# Patient Record
Sex: Male | Born: 1940 | Race: White | Hispanic: No | Marital: Married | Smoking: Never smoker
Health system: Southern US, Community
[De-identification: ages and names within clinical notes are randomized; demographics above are authoritative.]

## PROBLEM LIST (undated history)

## (undated) DIAGNOSIS — F419 Anxiety disorder, unspecified: Secondary | ICD-10-CM

## (undated) DIAGNOSIS — Z85828 Personal history of other malignant neoplasm of skin: Secondary | ICD-10-CM

## (undated) DIAGNOSIS — T4145XA Adverse effect of unspecified anesthetic, initial encounter: Secondary | ICD-10-CM

## (undated) DIAGNOSIS — G4700 Insomnia, unspecified: Secondary | ICD-10-CM

## (undated) DIAGNOSIS — T8859XA Other complications of anesthesia, initial encounter: Secondary | ICD-10-CM

## (undated) DIAGNOSIS — C61 Malignant neoplasm of prostate: Secondary | ICD-10-CM

## (undated) DIAGNOSIS — E785 Hyperlipidemia, unspecified: Secondary | ICD-10-CM

## (undated) DIAGNOSIS — H409 Unspecified glaucoma: Secondary | ICD-10-CM

## (undated) DIAGNOSIS — N400 Enlarged prostate without lower urinary tract symptoms: Secondary | ICD-10-CM

## (undated) DIAGNOSIS — R972 Elevated prostate specific antigen [PSA]: Secondary | ICD-10-CM

## (undated) DIAGNOSIS — Z8489 Family history of other specified conditions: Secondary | ICD-10-CM

## (undated) DIAGNOSIS — I10 Essential (primary) hypertension: Secondary | ICD-10-CM

## (undated) HISTORY — PX: TONSILLECTOMY: SUR1361

## (undated) HISTORY — DX: Personal history of other malignant neoplasm of skin: Z85.828

## (undated) HISTORY — PX: ANAL FISSURE REPAIR: SHX2312

## (undated) HISTORY — PX: COLONOSCOPY: SHX174

## (undated) HISTORY — PX: HERNIA REPAIR: SHX51

---
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
## 2005-04-09 DIAGNOSIS — Z85828 Personal history of other malignant neoplasm of skin: Secondary | ICD-10-CM

## 2005-04-09 HISTORY — DX: Personal history of other malignant neoplasm of skin: Z85.828

## 2013-06-23 ENCOUNTER — Observation Stay: Payer: Self-pay | Admitting: Family Medicine

## 2013-06-23 LAB — COMPREHENSIVE METABOLIC PANEL
ANION GAP: 5 — AB (ref 7–16)
Albumin: 3.4 g/dL (ref 3.4–5.0)
Alkaline Phosphatase: 51 U/L
BUN: 12 mg/dL (ref 7–18)
Bilirubin,Total: 0.5 mg/dL (ref 0.2–1.0)
CALCIUM: 8.8 mg/dL (ref 8.5–10.1)
Chloride: 95 mmol/L — ABNORMAL LOW (ref 98–107)
Co2: 29 mmol/L (ref 21–32)
Creatinine: 0.87 mg/dL (ref 0.60–1.30)
EGFR (African American): >60
EGFR (Non-African Amer.): >60
GLUCOSE: 96 mg/dL (ref 65–99)
Osmolality: 259 (ref 275–301)
Potassium: 4.2 mmol/L (ref 3.5–5.1)
SGOT(AST): 25 U/L (ref 15–37)
SGPT (ALT): 18 U/L (ref 12–78)
SODIUM: 129 mmol/L — AB (ref 136–145)
TOTAL PROTEIN: 6.6 g/dL (ref 6.4–8.2)

## 2013-06-23 LAB — URINALYSIS, COMPLETE
BILIRUBIN, UR: NEGATIVE
BLOOD: NEGATIVE
Bacteria: NONE SEEN
Glucose,UR: NEGATIVE mg/dL (ref 0–75)
Hyaline Cast: 2
Ketone: NEGATIVE
LEUKOCYTE ESTERASE: NEGATIVE
NITRITE: NEGATIVE
PH: 7 (ref 4.5–8.0)
Protein: NEGATIVE
RBC,UR: <1 /HPF (ref 0–5)
SQUAMOUS EPITHELIAL: NONE SEEN
Specific Gravity: 1.012 (ref 1.003–1.030)
WBC UR: 1 /HPF (ref 0–5)

## 2013-06-23 LAB — CBC
HCT: 40 % (ref 40.0–52.0)
HGB: 13.8 g/dL (ref 13.0–18.0)
MCH: 32.3 pg (ref 26.0–34.0)
MCHC: 34.5 g/dL (ref 32.0–36.0)
MCV: 93 fL (ref 80–100)
Platelet: 164 10*3/uL (ref 150–440)
RBC: 4.29 10*6/uL — AB (ref 4.40–5.90)
RDW: 12.6 % (ref 11.5–14.5)
WBC: 5.9 10*3/uL (ref 3.8–10.6)

## 2013-06-23 LAB — TROPONIN I
Troponin-I: <0.02 ng/mL
Troponin-I: <0.02 ng/mL
Troponin-I: <0.02 ng/mL

## 2013-06-23 LAB — CK-MB
CK-MB: 2.9 ng/mL (ref 0.5–3.6)
CK-MB: 2.9 ng/mL (ref 0.5–3.6)

## 2013-06-23 LAB — CK TOTAL AND CKMB (NOT AT ARMC)
CK, TOTAL: 186 U/L
CK-MB: 2.8 ng/mL (ref 0.5–3.6)

## 2013-06-24 LAB — CBC WITH DIFFERENTIAL/PLATELET
BASOS ABS: 0 10*3/uL (ref 0.0–0.1)
Basophil %: 0.4 %
EOS PCT: 1.1 %
Eosinophil #: 0.1 10*3/uL (ref 0.0–0.7)
HCT: 41.3 % (ref 40.0–52.0)
HGB: 14 g/dL (ref 13.0–18.0)
Lymphocyte #: 2.1 10*3/uL (ref 1.0–3.6)
Lymphocyte %: 29.4 %
MCH: 31.6 pg (ref 26.0–34.0)
MCHC: 33.9 g/dL (ref 32.0–36.0)
MCV: 93 fL (ref 80–100)
MONO ABS: 0.5 x10 3/mm (ref 0.2–1.0)
Monocyte %: 7.7 %
NEUTROS ABS: 4.3 10*3/uL (ref 1.4–6.5)
Neutrophil %: 61.4 %
Platelet: 176 10*3/uL (ref 150–440)
RBC: 4.42 10*6/uL (ref 4.40–5.90)
RDW: 12.7 % (ref 11.5–14.5)
WBC: 7 10*3/uL (ref 3.8–10.6)

## 2013-06-24 LAB — LIPID PANEL
Cholesterol: 123 mg/dL (ref 0–200)
HDL Cholesterol: 66 mg/dL — ABNORMAL HIGH (ref 40–60)
Ldl Cholesterol, Calc: 42 mg/dL (ref 0–100)
Triglycerides: 73 mg/dL (ref 0–200)
VLDL CHOLESTEROL, CALC: 15 mg/dL (ref 5–40)

## 2013-06-24 LAB — BASIC METABOLIC PANEL
Anion Gap: 5 — ABNORMAL LOW (ref 7–16)
BUN: 11 mg/dL (ref 7–18)
CREATININE: 0.92 mg/dL (ref 0.60–1.30)
Calcium, Total: 8.2 mg/dL — ABNORMAL LOW (ref 8.5–10.1)
Chloride: 100 mmol/L (ref 98–107)
Co2: 27 mmol/L (ref 21–32)
EGFR (African American): >60
GLUCOSE: 92 mg/dL (ref 65–99)
OSMOLALITY: 264 (ref 275–301)
Potassium: 4 mmol/L (ref 3.5–5.1)
Sodium: 132 mmol/L — ABNORMAL LOW (ref 136–145)

## 2013-06-24 LAB — TSH: THYROID STIMULATING HORM: 0.954 u[IU]/mL

## 2013-12-22 ENCOUNTER — Ambulatory Visit: Payer: Self-pay

## 2014-07-31 NOTE — Discharge Summary (Signed)
PATIENT NAME:  Vincent Schneider, Vincent Schneider MR#:  [id number] DATE OF BIRTH:  09-26-1940  DATE OF ADMISSION:  06/23/2013 DATE OF DISCHARGE:  06/24/2013  DISCHARGE DIAGNOSES: Syncope secondary to orthostatic hypotension, resolved.   DISCHARGE MEDICATIONS:  1.  Buspirone 7.5 mg p.o. b.i.d. 2.  Trazodone 150 mg p.o. at bedtime.  3.  Simvastatin 10 mg one tab p.o. at bedtime.  4.  Aspirin 81 mg p.o. daily.  5.  Melatonin 5 mg p.o. at bedtime.  6.  Tamsulosin 0.4 mg daily.  7.  Fluticasone nasal 50 mcg once per each nostril daily.  8.  Alavert 10 mg p.o. daily.  9.  Xalatan 0.005% ophthalmic solution one drop each eye at bedtime.   CONSULTS: None.   PROCEDURES: None.   PERTINENT LABORATORIES AND STUDIES: CT of the head was negative.   EKG showed normal sinus rhythm with AV block.   Echo showed EF of 60% to 65%.   Cardiac enzymes negative x3.   Carotid ultrasound less than 50% stenosis bilaterally.   TSH 0.95, sodium 132, potassium 4, creatinine 0.92, glucose 92. White blood cell count 7, hemoglobin 14, platelets 176.   BRIEF HOSPITAL COURSE: Syncope secondary to hypotension, orthostatic. The patient initially came in with acute syncopal episode. When blood pressure was checked in the ER, it did drop from 140s to 110s. Full workup was performed. CT of the head was negative. Studies as stated above. On day of discharge, the patient's blood pressure remained stable with different positions. I believe that this was secondary to low hydration, low volume status along with Flomax. Given his significant benign prostatic hypertrophy symptoms, we will continue with the Flomax since his blood pressure is now stabilized, is not have any further symptoms. Will focus more on hydration.   PLAN: To discharge to home.   FOLLOWUP: Dr. Netty Starring within the next week. We will try to get him in next Tuesday for his annual physical and also for his hospital followup appointment.   ____________________________ Dion Body, MD kl:np D: 06/24/2013 14:43:37 ET T: 06/24/2013 21:50:07 ET JOB#: [id number]  cc: Dion Body, MD, <Dictator> Dion Body MD ELECTRONICALLY SIGNED 07/07/2013 15:21

## 2014-07-31 NOTE — H&P (Signed)
PATIENT NAME:  Vincent, Schneider MR#:  [id number] DATE OF BIRTH:  [birth year]  DATE OF ADMISSION:  06/23/2013  PRIMARY CARE PROVIDER: Dion Body, MD  ED REFERRING PHYSICIAN: Dr. Owens Shark  CHIEF COMPLAINT: Syncope.   HISTORY OF PRESENT ILLNESS: The patient is a 74 year old white male with history of hyperlipidemia, history of BPH, and depression who states that he was in his usual state of health up until about 10 days ago when he started having severe sinus drainage and then about a week ago he was in his exercise class and hurt his back on the right, but now that is improved, but over the past 3 to 4 days he has been feeling very weak and lack of energy. He has been feeling dizzy, especially with standing. Earlier today woke up at 5:30 to make some coffee and felt dizzy and passed out. He found himself on the floor. The patient was brought to the ED and he was noted to have a sodium level of 129 and when his blood pressure was checked it did drop to 110s from 140s. He otherwise has not been having any chest pain or shortness of breath or palpitations. He has not had any nausea, vomiting, or diarrhea. He has been eating well, but not drinking as good. He denies any fevers, chills. No urinary frequency, urgency or hesitancy.   PAST MEDICAL HISTORY: 1.  Hyperlipidemia.  2.  BPH. 3.  Depression.   PAST SURGICAL HISTORY:  1.  Status post tonsillectomy. 2.  Hernia repair. 3.  Anal fissure repair.   ALLERGIES: None.   CURRENT HOME MEDICATIONS: Alavert 10 mg 1 tab p.o. daily, aspirin 81 one 1 tab p.o. daily, buspirone 7.5 one tab p.o. b.i.d., fluticasone 1 spray daily, melatonin 5 daily, simvastatin 10 at bedtime, Flomax 0.4 daily, trazodone 150 at bedtime, Xalatan eye drops 1 drop to both effected eyes at bedtime.   SOCIAL HISTORY: Does not smoke. Drinks 1 glass of drink daily. No drug use.   FAMILY HISTORY: Father died of a CVA. Mother lived up to 63.  REVIEW OF SYSTEMS:  CONSTITUTIONAL:  Denies any fevers, chills. No weight loss or weight gain.  HEENT: Denies any visual difficulties. No blurred vision. Has a history of glaucoma. No cataracts. EARS, NOSE, AND THROAT: Complains of postnasal drip. Denies any difficulty with swallowing. Denies any ringing in the ears. No difficulty hearing.  CARDIOVASCULAR: Denies any chest pains, palpitations. Has syncope as above. No history of coronary artery disease. No hypertension.  PULMONARY: Denies any cough, wheezing, hemoptysis.  GASTROINTESTINAL: Denies any nausea, vomiting, diarrhea.  GENITOURINARY: Denies any frequency, urgency or hesitancy.  SKIN: Denies any skin rash.  LYMPHATICS: Denies any lymph node enlargement.  VASCULAR: Denies any claudication symptoms.  PSYCHIATRIC: Does have a history of some depression.  NEUROLOGIC: No CVA, TIA or seizures.   PHYSICAL EXAMINATION: VITAL SIGNS: Temperature 97.6, pulse 75, respirations 18, blood pressure 115/76, O2 98%.  GENERAL: The patient is a well-developed, well-nourished male in no acute distress.  HEENT: Head atraumatic, normocephalic. Pupils equally round and reactive to light and accommodation. There is no conjunctival pallor. No scleral icterus. Nasal exam shows no drainage or ulceration. Oropharynx is clear without any exudate.  NECK: Supple without any JVD.  CARDIOVASCULAR: Regular rate and rhythm. No murmurs, rubs.  LUNGS: Clear to auscultation. ABDOMEN: Soft, nontender, nondistended. Positive bowel sounds x4. No hepatosplenomegaly.  EXTREMITIES: No clubbing, cyanosis, or edema.  SKIN: No rash.  LYMPHATICS: No lymph nodes throughout palpable.  VASCULAR: Lower extremity: Good DP, PT pulses.   DIAGNOSTIC DATA: CT scan of the head shows small vessel disease. No acute abnormality.   Urinalysis: Nitrites negative, leukocytes negative. WBC 5.9, hemoglobin 13.8, platelet count 164,000. Troponin less than 0.02. LFTs are normal. Glucose 96, BUN 12, creatinine 0.87, sodium 129,  potassium 4.2, chloride 95, CO2 22, calcium 8.8.   EKG: Normal sinus rhythm with left axis deviation.   ASSESSMENT AND PLAN: The patient is a 74 year old white male who presents with dizziness for the past few days and now has syncope with collapse.  1.  Syncope. Likely due to dehydration with low sodium and orthostatic changes. At this time, we will give him IV fluids, repeat orthostatic blood pressure in the morning. We will also place him on telemetry. Check echocardiogram of the heart and carotid Dopplers.  2.  Benign prostatic hypertrophy. Continue Flomax.  3.  Depression. Continue trazodone and bupropion. 4.  Hyperlipidemia. Continue simvastatin as taking at home.  5.  Miscellaneous. The patient will be on heparin for deep vein thrombosis prophylaxis.  The patient's care will be transferred to Dr. Netty Starring.  TIME SPENT: 50 minutes.   ____________________________ Vincent Mosses Posey Pronto, MD shp:sb D: 06/23/2013 09:21:28 ET T: 06/23/2013 09:33:40 ET JOB#: [id number]  cc: Peggie Hornak H. Posey Pronto, MD, <Dictator> Alric Seton MD ELECTRONICALLY SIGNED 06/24/2013 8:27

## 2014-09-26 ENCOUNTER — Emergency Department
Admission: EM | Admit: 2014-09-26 | Discharge: 2014-09-26 | Disposition: A | Discharge status: Home / Self Care | Payer: Medicare Other | Attending: Emergency Medicine | Admitting: Emergency Medicine

## 2014-09-26 ENCOUNTER — Emergency Department: Payer: Medicare Other

## 2014-09-26 ENCOUNTER — Encounter: Payer: Self-pay | Admitting: Emergency Medicine

## 2014-09-26 DIAGNOSIS — K5900 Constipation, unspecified: Secondary | ICD-10-CM | POA: Insufficient documentation

## 2014-09-26 DIAGNOSIS — R109 Unspecified abdominal pain: Secondary | ICD-10-CM | POA: Diagnosis present

## 2014-09-26 DIAGNOSIS — R1030 Lower abdominal pain, unspecified: Secondary | ICD-10-CM

## 2014-09-26 HISTORY — DX: Elevated prostate specific antigen (PSA): R97.20

## 2014-09-26 HISTORY — DX: Anxiety disorder, unspecified: F41.9

## 2014-09-26 HISTORY — DX: Insomnia, unspecified: G47.00

## 2014-09-26 HISTORY — DX: Unspecified glaucoma: H40.9

## 2014-09-26 HISTORY — DX: Benign prostatic hyperplasia without lower urinary tract symptoms: N40.0

## 2014-09-26 HISTORY — DX: Hyperlipidemia, unspecified: E78.5

## 2014-09-26 LAB — COMPREHENSIVE METABOLIC PANEL
ALK PHOS: 47 U/L (ref 38–126)
ALT: 13 U/L — ABNORMAL LOW (ref 17–63)
AST: 19 U/L (ref 15–41)
Albumin: 4 g/dL (ref 3.5–5.0)
Anion gap: 7 (ref 5–15)
BILIRUBIN TOTAL: 1 mg/dL (ref 0.3–1.2)
BUN: 12 mg/dL (ref 6–20)
CHLORIDE: 95 mmol/L — AB (ref 101–111)
CO2: 28 mmol/L (ref 22–32)
CREATININE: 0.88 mg/dL (ref 0.61–1.24)
Calcium: 8.4 mg/dL — ABNORMAL LOW (ref 8.9–10.3)
GFR calc Af Amer: >60 mL/min (ref >60)
GFR calc non Af Amer: >60 mL/min (ref >60)
Glucose, Bld: 108 mg/dL — ABNORMAL HIGH (ref 65–99)
Potassium: 4.1 mmol/L (ref 3.5–5.1)
Sodium: 130 mmol/L — ABNORMAL LOW (ref 135–145)
Total Protein: 6.6 g/dL (ref 6.5–8.1)

## 2014-09-26 LAB — CBC
HCT: 37.8 % — ABNORMAL LOW (ref 40.0–52.0)
HEMOGLOBIN: 12.7 g/dL — AB (ref 13.0–18.0)
MCH: 31.4 pg (ref 26.0–34.0)
MCHC: 33.5 g/dL (ref 32.0–36.0)
MCV: 93.8 fL (ref 80.0–100.0)
PLATELETS: 187 10*3/uL (ref 150–440)
RBC: 4.03 MIL/uL — AB (ref 4.40–5.90)
RDW: 13.7 % (ref 11.5–14.5)
WBC: 10.8 10*3/uL — AB (ref 3.8–10.6)

## 2014-09-26 LAB — LIPASE, BLOOD: Lipase: 25 U/L (ref 22–51)

## 2014-09-26 MED ORDER — HYDROMORPHONE HCL 1 MG/ML IJ SOLN
INTRAMUSCULAR | Status: AC
  Administered 2014-09-26: 0.5 mg via INTRAVENOUS
  Filled 2014-09-26: qty 1

## 2014-09-26 MED ORDER — ONDANSETRON HCL 4 MG/2ML IJ SOLN
INTRAMUSCULAR | Status: AC
  Administered 2014-09-26: 4 mg via INTRAVENOUS
  Filled 2014-09-26: qty 2

## 2014-09-26 MED ORDER — MAGNESIUM CITRATE PO SOLN
ORAL | Status: AC
  Administered 2014-09-26: 1 via ORAL
  Filled 2014-09-26: qty 296

## 2014-09-26 MED ORDER — ONDANSETRON HCL 4 MG/2ML IJ SOLN
4.0000 mg | INTRAMUSCULAR | Status: AC
  Administered 2014-09-26: 4 mg via INTRAVENOUS

## 2014-09-26 MED ORDER — MAGNESIUM CITRATE PO SOLN
1.0000 | Freq: Once | ORAL | Status: AC
  Administered 2014-09-26: 1 via ORAL

## 2014-09-26 MED ORDER — MORPHINE SULFATE 4 MG/ML IJ SOLN
INTRAMUSCULAR | Status: AC
  Administered 2014-09-26: 4 mg via INTRAVENOUS
  Filled 2014-09-26: qty 1

## 2014-09-26 MED ORDER — IOHEXOL 240 MG/ML SOLN
25.0000 mL | Freq: Once | INTRAMUSCULAR | Status: AC | PRN
  Administered 2014-09-26: 25 mL via ORAL

## 2014-09-26 MED ORDER — IOHEXOL 300 MG/ML  SOLN
100.0000 mL | Freq: Once | INTRAMUSCULAR | Status: AC | PRN
  Administered 2014-09-26: 100 mL via INTRAVENOUS

## 2014-09-26 MED ORDER — MORPHINE SULFATE 4 MG/ML IJ SOLN
4.0000 mg | Freq: Once | INTRAMUSCULAR | Status: AC
  Administered 2014-09-26: 4 mg via INTRAVENOUS

## 2014-09-26 MED ORDER — SODIUM CHLORIDE 0.9 % IV BOLUS (SEPSIS)
1000.0000 mL | Freq: Once | INTRAVENOUS | Status: AC
  Administered 2014-09-26: 1000 mL via INTRAVENOUS

## 2014-09-26 MED ORDER — HYDROMORPHONE HCL 1 MG/ML IJ SOLN
0.5000 mg | INTRAMUSCULAR | Status: AC
  Administered 2014-09-26: 0.5 mg via INTRAVENOUS

## 2014-09-26 NOTE — ED Notes (Signed)
Pt sent from [facility]. Pt with generalized abd pain for 2 days. Nausea denies vomiting. Very small stool this am. Abd Distended and no passing gas per pt. Pt with low grade fever 99.4 and elevated WBC 12.2 from the clinic today. X-ray results "mildly dilated bowel with no free air. " recommended CT scan.

## 2014-09-26 NOTE — Discharge Instructions (Signed)
Abdominal Pain  You were seen in the emergency department today for constipation.  We recommend that you use one or more of the following over-the-counter medications in the order described:   1)  Colace (or Dulcolax) 100 mg:  This is a stool softener, and you may take it once or twice a day as needed. 2)  Senna tablets:  This is a bowel stimulant that will help "push" out your stool. It is the next step to add after you have tried a stool softener. 3)  Miralax (powder):  This medication works by drawing additional fluid into your intestines and helps to flush out your stool.  Mix the powder with water or juice according to label instructions.  It may help if the Colace and Senna are not sufficient, but you must be sure to use the recommended amount of water or juice when you mix up the powder. Remember that narcotic pain medications are constipating, so avoid them or minimize their use.  Drink plenty of fluids.  Please return to the Emergency Department immediately if you develop new or worsening symptoms that concern you, such as (but not limited to) fever > 101 degrees, severe abdominal pain, or vomiting, weakness, or other new concerns arise.   Many things can cause abdominal pain. Usually, abdominal pain is not caused by a disease and will improve without treatment. It can often be observed and treated at home. Your health care provider will do a physical exam and possibly order blood tests and X-rays to help determine the seriousness of your pain. However, in many cases, more time must pass before a clear cause of the pain can be found. Before that point, your health care provider may not know if you need more testing or further treatment. HOME CARE INSTRUCTIONS  Monitor your abdominal pain for any changes. The following actions may help to alleviate any discomfort you are experiencing:  Only take over-the-counter or prescription medicines as directed by your health care provider.  Do not  take laxatives unless directed to do so by your health care provider.  Try a clear liquid diet (broth, tea, or water) as directed by your health care provider. Slowly move to a bland diet as tolerated. SEEK MEDICAL CARE IF:  You have unexplained abdominal pain.  You have abdominal pain associated with nausea or diarrhea.  You have pain when you urinate or have a bowel movement.  You experience abdominal pain that wakes you in the night.  You have abdominal pain that is worsened or improved by eating food.  You have abdominal pain that is worsened with eating fatty foods.  You have a fever. SEEK IMMEDIATE MEDICAL CARE IF:   Your pain does not go away within 2 hours.  You keep throwing up (vomiting).  Your pain is felt only in portions of the abdomen, such as the right side or the left lower portion of the abdomen.  You pass bloody or black tarry stools. MAKE SURE YOU:  Understand these instructions.   Will watch your condition.   Will get help right away if you are not doing well or get worse.  Document Released: 01/03/2005 Document Revised: 03/31/2013 Document Reviewed: 12/03/2012 [facility] Patient Information 2015 [place], [place]. This information is not intended to replace advice given to you by your health care provider. Make sure you discuss any questions you have with your health care provider.

## 2014-09-26 NOTE — ED Provider Notes (Signed)
[facility] Emergency Department Provider Note  ____________________________________________  Time seen: Approximately 4:02 PM  I have reviewed the triage vital signs and the nursing notes.   HISTORY  Chief Complaint Abdominal Pain    HPI Vincent Schneider is a 74 y.o. male has been having 2 days of abdominal pain. He does feel nauseated. He reports not being able to have a bowel movement for 2 days and is not passing any gas. He feels his stomach is bloated. He also reports having a low-grade temperature to 100 Fahrenheit at home.  Pain is described as crampy, throat entire abdomen,. History of previous abdominal surgeries. No known drug allergies. No recent medication changes.  No groin or testicular pain.  Past Medical History  Diagnosis Date  . Anxiety   . BPH (benign prostatic hyperplasia)   . Glaucoma   . Hyperlipemia   . Insomnia   . PSA elevation     There are no active problems to display for this patient.   Past Surgical History  Procedure Laterality Date  . Tonsillectomy    . Anal fissure repair    . Hernia repair      No current outpatient prescriptions on file.  Allergies Review of patient's allergies indicates no known allergies.  No family history on file.  Social History History  Substance Use Topics  . Smoking status: Never Smoker   . Smokeless tobacco: Never Used  . Alcohol Use: 2.4 oz/week    4 Shots of liquor per week    Review of Systems Constitutional: No fever/chills Eyes: No visual changes. ENT: No sore throat. Cardiovascular: Denies chest pain. Respiratory: Denies shortness of breath. Gastrointestinal: See history of present illness. No diarrhea.  No constipation. Genitourinary: Negative for dysuria. Musculoskeletal: Negative for back pain. Skin: Negative for rash. Neurological: Negative for headaches, focal weakness or numbness.  10-point ROS otherwise  negative.  ____________________________________________   PHYSICAL EXAM:  VITAL SIGNS: ED Triage Vitals  Enc Vitals Group     BP 09/26/14 1516 141/79 mmHg     Pulse Rate 09/26/14 1516 77     Resp 09/26/14 1516 18     Temp 09/26/14 1516 99.4 F (37.4 C)     Temp Source 09/26/14 1516 Oral     SpO2 09/26/14 1516 100 %     Weight 09/26/14 1516 190 lb (86.183 kg)     Height 09/26/14 1516 6\' 3"  (1.905 m)     Head Cir --      Peak Flow --      Pain Score 09/26/14 1524 8     Pain Loc --      Pain Edu? --      Excl. in [place]? --     Constitutional: Alert and oriented. Well appearing and in no acute distress. Eyes: Conjunctivae are normal. PERRL. EOMI. Head: Atraumatic. Nose: No congestion/rhinnorhea. Mouth/Throat: Mucous membranes are moist.  Oropharynx non-erythematous. Neck: No stridor.   Cardiovascular: Normal rate, regular rhythm. Grossly normal heart sounds.  Good peripheral circulation. Respiratory: Normal respiratory effort.  No retractions. Lungs CTAB. Gastrointestinal: Soft and nontender. No distention. No abdominal bruits. No CVA tenderness. Genitourinary: No groin swelling or mass. Testicles nontender. Circumcised. Normal GU exam Musculoskeletal: No lower extremity tenderness nor edema.  No joint effusions. Neurologic:  Normal speech and language. No gross focal neurologic deficits are appreciated. Speech is normal. No gait instability. Skin:  Skin is warm, dry and intact. No rash noted. Psychiatric: Mood and affect are normal. Speech  and behavior are normal.  ____________________________________________   LABS (all labs ordered are listed, but only abnormal results are displayed)  Labs Reviewed  CBC - Abnormal; Notable for the following:    WBC 10.8 (*)    RBC 4.03 (*)    Hemoglobin 12.7 (*)    HCT 37.8 (*)    All other components within normal limits  COMPREHENSIVE METABOLIC PANEL - Abnormal; Notable for the following:    Sodium 130 (*)    Chloride 95 (*)     Glucose, Bld 108 (*)    Calcium 8.4 (*)    ALT 13 (*)    All other components within normal limits  LIPASE, BLOOD  URINALYSIS COMPLETEWITH MICROSCOPIC ([facility] ONLY)   ____________________________________________  EKG   ____________________________________________  RADIOLOGY  CT Abdomen Pelvis W Contrast (Final result) Result time: 09/26/14 17:22:55   Final result by Rad Results In Interface (09/26/14 17:22:55)   Narrative:   CLINICAL DATA: Abdomen pelvic pain and distention for the past 2 days. Some nausea initially. Two previous hernia repairs.  EXAM: CT ABDOMEN AND PELVIS WITH CONTRAST  TECHNIQUE: Multidetector CT imaging of the abdomen and pelvis was performed using the standard protocol following bolus administration of intravenous contrast.  CONTRAST: 171mL OMNIPAQUE IOHEXOL 300 MG/ML SOLN  COMPARISON: None.  FINDINGS: Multiple small liver cysts. Prominent stool in the colon. Scattered colonic diverticula without evidence of diverticulitis.  Small left renal cysts. Unremarkable spleen, pancreas, gallbladder, adrenal glands, right kidney, ureters and urinary bladder other than indentation into the bladder by on a moderately enlarged prostate gland. Very small umbilical hernia containing fat.  Normal appearing appendix in the right upper pelvis. No enlarged lymph nodes. Atheromatous arterial calcifications. Mild hyperexpansion of the lungs at the lung bases with mild bibasilar linear atelectasis or scarring. Lumbar and lower thoracic spine degenerative changes. Mild bilateral hip degenerative changes.  IMPRESSION: 1. Prominent stool in the colon. 2. Colonic diverticulosis without evidence of diverticulitis. 3. Mild changes of COPD. 4. Very small umbilical hernia containing fat. 5. Moderately enlarged prostate gland.    ____________________________________________   PROCEDURES  Procedure(s) performed: None  Critical Care performed:  No  ____________________________________________   INITIAL IMPRESSION / ASSESSMENT AND PLAN / ED COURSE  Pertinent labs & imaging results that were available during my care of the patient were reviewed by me and considered in my medical decision making (see chart for details).  Patient presents with 2 days of abdominal pain, nausea. Unable to pass gas or stool for 2 days. Abdomen distended. Differential diagnosis would certainly include bowel obstruction, appendicitis, diverticulitis, ileus, constipation and other etiologies. We will obtain CT imaging to further evaluate. No cardiopulmonary symptoms. Awake alert with stable vital signs.  ----------------------------------------- 7:31 PM on 09/26/2014 -----------------------------------------  Patient had a moderate bowel movement after enema. He does still have some crampy pain and feels "constipated but is in no distress. He is awake and alert. Repeat abdominal exam is improved, he has no rebound pain or guarding. He has no focal tenderness. He does have moderate to mild tenderness along the right mid abdomen.  CT scan is reassuring. No evidence of acute intra-abdominal infection. At this point, further discussed the patient and he has been actually able to pass some gas today. At this point, nothing to support acute obstruction or infection. I will discharge him to home. I did discuss with him that this may be from constipation but alternatively he should return to the ER right away if he develops severe pain,  vomiting, severe nausea, blood in his stool, a measurable fever or other concerns arise.  We'll give him magnesium citrate, and he will resume twice daily MiraLAX until stooling. He'll follow-up with his primary care doctor in the next 1-2 days and return to the ER sooner if he is not having a good bowel movement and symptoms are not improved by midday tomorrow.  Wife is driving him home. Discussed no driving himself tonight due to  "morphine." ____________________________________________   FINAL CLINICAL IMPRESSION(S) / ED DIAGNOSES  Final diagnoses:  Lower abdominal pain  Constipation, unspecified constipation type      Delman Kitten, MD 09/26/14 1935

## 2014-09-26 NOTE — ED Notes (Signed)
E-signature not working.  Had pt sign on copied discharge instructions.

## 2014-12-08 ENCOUNTER — Other Ambulatory Visit (HOSPITAL_COMMUNITY): Payer: Self-pay | Admitting: Urology

## 2014-12-08 DIAGNOSIS — R972 Elevated prostate specific antigen [PSA]: Secondary | ICD-10-CM

## 2014-12-21 ENCOUNTER — Other Ambulatory Visit (HOSPITAL_COMMUNITY): Payer: Self-pay | Admitting: Radiology

## 2014-12-21 ENCOUNTER — Other Ambulatory Visit (HOSPITAL_COMMUNITY): Payer: Self-pay | Admitting: Urology

## 2014-12-21 ENCOUNTER — Ambulatory Visit (HOSPITAL_COMMUNITY)
Admission: RE | Admit: 2014-12-21 | Discharge: 2014-12-21 | Disposition: A | Discharge status: Home / Self Care | Payer: Medicare Other | Source: Ambulatory Visit | Attending: Urology | Admitting: Urology

## 2014-12-21 DIAGNOSIS — N400 Enlarged prostate without lower urinary tract symptoms: Secondary | ICD-10-CM | POA: Diagnosis not present

## 2014-12-21 DIAGNOSIS — R972 Elevated prostate specific antigen [PSA]: Secondary | ICD-10-CM

## 2014-12-21 LAB — POCT I-STAT CREATININE: Creatinine, Ser: 0.9 mg/dL (ref 0.61–1.24)

## 2014-12-21 MED ORDER — GADOBENATE DIMEGLUMINE 529 MG/ML IV SOLN
20.0000 mL | Freq: Once | INTRAVENOUS | Status: AC | PRN
  Administered 2014-12-21: 19 mL via INTRAVENOUS

## 2015-10-18 ENCOUNTER — Encounter: Payer: Self-pay | Admitting: Radiation Oncology

## 2015-10-18 NOTE — Progress Notes (Signed)
GU Location of Tumor / Histology: prostatic adenocarcinoma  If Prostate Cancer, Gleason Score is (4 + 3) and PSA is (43.66) on 11/29/14  Vincent Schneider has been undergoing prostate cancer screening since approximately his 89's. Patient reports he has had four prostate biopsies.  Biopsies of prostate (if applicable) revealed:    Past/Anticipated interventions by urology, if any: MRI fusion biopsy. Revealed a prostate volume of 114 cc. Also, he was found to have a large tumor surrounding the periurethral area of the prostate.  Past/Anticipated interventions by medical oncology, if any: no  Weight changes, if any: no  Bowel/Bladder complaints, if any:  Intermittent urine stream, occasional urinary urgency and weak stream, reports nocturia x 3, reports occasional leakage and post void dribble. Denies dysuria or hematuria. Reports he manages constipation by increasing his fiber or taking Miralax. Patient confirms taking Flomax as directed.   Nausea/Vomiting, if any: no  Pain issues, if any:  no  SAFETY ISSUES:  Prior radiation? no  Pacemaker/ICD? no  Possible current pregnancy? no  Is the patient on methotrexate? no  Current Complaints / other details:  75 year old male. Married. Retired. NK[employer]

## 2015-10-19 ENCOUNTER — Encounter: Payer: Self-pay | Admitting: Medical Oncology

## 2015-10-19 ENCOUNTER — Ambulatory Visit
Admission: RE | Admit: 2015-10-19 | Discharge: 2015-10-19 | Disposition: A | Discharge status: Home / Self Care | Payer: Medicare Other | Source: Ambulatory Visit | Attending: Radiation Oncology | Admitting: Radiation Oncology

## 2015-10-19 ENCOUNTER — Encounter: Payer: Self-pay | Admitting: Radiation Oncology

## 2015-10-19 ENCOUNTER — Telehealth: Payer: Self-pay | Admitting: *Deleted

## 2015-10-19 VITALS — BP 149/95 | HR 63 | Resp 16 | Ht 75.0 in | Wt 192.8 lb

## 2015-10-19 DIAGNOSIS — C61 Malignant neoplasm of prostate: Secondary | ICD-10-CM | POA: Diagnosis present

## 2015-10-19 DIAGNOSIS — H409 Unspecified glaucoma: Secondary | ICD-10-CM | POA: Diagnosis not present

## 2015-10-19 DIAGNOSIS — Z510 Encounter for antineoplastic radiation therapy: Secondary | ICD-10-CM | POA: Diagnosis not present

## 2015-10-19 DIAGNOSIS — E785 Hyperlipidemia, unspecified: Secondary | ICD-10-CM | POA: Diagnosis not present

## 2015-10-19 DIAGNOSIS — F419 Anxiety disorder, unspecified: Secondary | ICD-10-CM | POA: Diagnosis not present

## 2015-10-19 DIAGNOSIS — Z7982 Long term (current) use of aspirin: Secondary | ICD-10-CM | POA: Diagnosis not present

## 2015-10-19 DIAGNOSIS — Z79899 Other long term (current) drug therapy: Secondary | ICD-10-CM | POA: Diagnosis not present

## 2015-10-19 HISTORY — DX: Malignant neoplasm of prostate: C61

## 2015-10-19 NOTE — Progress Notes (Signed)
Radiation Oncology         [phone]) [phone] ________________________________  Initial Outpatient Consultation  Name: Vincent Schneider MRN: [id number]  Date: 10/19/2015  DOB: 01/19/1941  CC:Pcp Not In System  No ref. provider found   REFERRING PHYSICIAN: No ref. provider found  DIAGNOSIS: 75 y.o. gentleman with stage T1c adenocarcinoma of the prostate with a Gleason's score of 4+3 and a PSA of 43.66.    ICD-9-CM ICD-10-CM   1. Malignant neoplasm of prostate (HCC) 185 C61 aspirin EC 81 MG tablet     Omega-3 Fatty Acids (FISH OIL CONCENTRATE) 300 MG CAPS     b complex vitamins capsule     latanoprost (XALATAN) 0.005 % ophthalmic solution     simvastatin (ZOCOR) 10 MG tablet     tamsulosin (FLOMAX) 0.4 MG CAPS capsule     traZODone (DESYREL) 150 MG tablet     Multiple Vitamins-Minerals (MEGA MULTIVITAMIN FOR MEN PO)     Melatonin 5 MG CAPS     PSA     PSA     NM Bone Scan Whole Body     Ambulatory referral to Urology    HISTORY OF PRESENT ILLNESS::Vincent Schneider is a 75 y.o. gentleman. The patient has been undergoing prostate cancer screening since approximately his 32's. His baseline PSA tends to run about 9.  Outside negative biopsies x 2 prior to 2005 07/20/09 - Outside PSA 9.1 02/15/10 - Outside PSA 9.4 08/07/10 - Outside PSA 10.1 02/16/11 - Outside PSA 12.2 08/17/11 - Outside PSA 11.4 04/15/12 - Outside PSA 13.0  He was noted to have an elevated PSA of 43.66 on 11/29/14 by his primary care physician, Dr. Netty Starring.  12/21/14 - A prostate MRI noted two 6 mm foci of abnormal signal within the left peripheral zone suspicious for low volume carcinoma. Given restricted diffusion and early post-contrast enhancement, they may represent relatively higher grade disease.  01/14/15 - Outside prostate biopsy negative, Vol 139 grams.  Accordingly, he was referred for evaluation in urology by Dr. Gaynelle Arabian. The patient proceeded to transrectal ultrasound with 12 biopsies of the prostate on  12/21/14.  The prostate volume measured 139 grams.  Out of 12 core biopsies, none were positive. Prostatectomy was recommended, but the patient declined.  The patient was referred to Dr. Thomos Lemons, of [facility], on 02/15/15.  04/14/15 - MpMRI - Observation 1: Right apical anterior and posterior transitional zone, with involvement of the periurethral tissues and likely anterior fibromuscular septum, measuring 2.4 x 1.6 x 1.2 cm. Broad-based anterior capsular abutment raises the question of microscopic extraprostatic extension. (PI-RADS 5). Inflammation of a sigmoid diverticulum could reflect low-grade diverticulitis.  08/17/15 - MRI fusion biopsy by Dr. Thomos Lemons - 4.7 cm height x 5.7 cm width x 7.9 cm length (last dimension is sagittal). The estimated prostate volume is 114 cc. Path: Out of 7 biopsies, 1 was positive. Gleason 4+3=7, 28/60 mm, right transition zone apex.  The patient reviewed the biopsy results with his urologist and he has kindly been referred today for discussion of potential radiation treatment options.  PREVIOUS RADIATION THERAPY: No  PAST MEDICAL HISTORY:  has a past medical history of Anxiety; BPH (benign prostatic hyperplasia); Glaucoma; Hyperlipemia; Insomnia; PSA elevation; and Prostate cancer ([place]).    PAST SURGICAL HISTORY: Past Surgical History  Procedure Laterality Date  . Tonsillectomy    . Anal fissure repair    . Hernia repair      FAMILY HISTORY: family history is negative for Cancer.  SOCIAL  HISTORY:  reports that he has never smoked. He has never used smokeless tobacco. He reports that he drinks about 2.4 oz of alcohol per week. He reports that he does not use illicit drugs.  ALLERGIES: Review of patient's allergies indicates no known allergies.  MEDICATIONS:  Current Outpatient Prescriptions  Medication Sig Dispense Refill  . aspirin EC 81 MG tablet Take by mouth.    Marland Kitchen b complex vitamins capsule Take by mouth.    . latanoprost (XALATAN) 0.005 %  ophthalmic solution Apply to eye.    . Melatonin 5 MG CAPS Take by mouth.    . Multiple Vitamins-Minerals (MEGA MULTIVITAMIN FOR MEN PO) Take by mouth.    . Omega-3 Fatty Acids (FISH OIL CONCENTRATE) 300 MG CAPS Take by mouth.    . simvastatin (ZOCOR) 10 MG tablet     . tamsulosin (FLOMAX) 0.4 MG CAPS capsule Take by mouth.    . traZODone (DESYREL) 150 MG tablet      No current facility-administered medications for this encounter.    REVIEW OF SYSTEMS:  A 15 point review of systems is documented in the electronic medical record. This was obtained by the nursing staff. However, I reviewed this with the patient to discuss relevant findings and make appropriate changes.  Pertinent items are noted in HPI..  The patient completed an IPSS and IIEF questionnaire.  His IPSS score was 15 indicating moderate urinary outflow obstructive symptoms.  He indicated that his erectile function is able to complete sexual activity on most attempts.   PHYSICAL EXAM: This patient is in no acute distress.  He is alert and oriented.   height is 6\' 3"  (1.905 m) and weight is 192 lb 12.8 oz (87.454 kg). His blood pressure is 149/95 and his pulse is 63. His respiration is 16 and oxygen saturation is 100%.  He exhibits no respiratory distress or labored breathing.  He appears neurologically intact.  His mood is pleasant.  His affect is appropriate.  Please note the digital rectal exam findings described above.  KPS = 100  100 - Normal; no complaints; no evidence of disease. 90   - Able to carry on normal activity; minor signs or symptoms of disease. 80   - Normal activity with effort; some signs or symptoms of disease. 43   - Cares for self; unable to carry on normal activity or to do active work. 60   - Requires occasional assistance, but is able to care for most of his personal needs. 50   - Requires considerable assistance and frequent medical care. 85   - Disabled; requires special care and assistance. 26   -  Severely disabled; hospital admission is indicated although death not imminent. 69   - Very sick; hospital admission necessary; active supportive treatment necessary. 10   - Moribund; fatal processes progressing rapidly. 0     - Dead  Karnofsky DA, Abelmann [place], Craver LS and Burchenal [facility] [phone]) The use of the nitrogen mustards in the palliative treatment of carcinoma: with particular reference to bronchogenic carcinoma Cancer 1 634-56   LABORATORY DATA:  Lab Results  Component Value Date   WBC 10.8* 09/26/2014   HGB 12.7* 09/26/2014   HCT 37.8* 09/26/2014   MCV 93.8 09/26/2014   PLT 187 09/26/2014   Lab Results  Component Value Date   NA 130* 09/26/2014   K 4.1 09/26/2014   CL 95* 09/26/2014   CO2 28 09/26/2014   Lab Results  Component Value Date  ALT 13* 09/26/2014   AST 19 09/26/2014   ALKPHOS 47 09/26/2014   BILITOT 1.0 09/26/2014     RADIOGRAPHY: No results found.    IMPRESSION: This gentleman is a 75 y.o. gentleman with stage T1c adenocarcinoma of the prostate with a Gleason's score of 4+3 and a PSA of 43.66.  His T-Stage, Gleason's Score, and PSA put him into the high risk group.  Accordingly he is eligible for a variety of potential treatment options including prostatectomy or IMRT with or without possible androgen depravation therapy.  PLAN: Today I reviewed the findings and workup thus far.  We discussed the natural history of prostate cancer.  We reviewed the the implications of T-stage, Gleason's Score, and PSA on decision-making and outcomes in prostate cancer.  We discussed radiation treatment in the management of prostate cancer with regard to the logistics and delivery of external beam radiation treatment as well as the logistics and delivery of prostate brachytherapy.  We compared and contrasted each of these approaches and also compared these against prostatectomy.  The patient expressed interest in external beam radiotherapy.  I filled out a patient counseling  form for him with relevant treatment diagrams and we retained a copy for our records.   The patient would like to proceed with prostate IMRT.  I will refer the patient back to urology and move forward with scheduling placement of three gold fiducial markers into the prostate to proceed with IMRT in the near future. I will send him to the lab today for a PSA test since his last one was in August 2016 and I will also order a bone scan to rule out metastasis.  I enjoyed meeting with him today, and will look forward to participating in the care of this very nice gentleman.  I spent time face to face with the patient and more than 50% of that time was spent in counseling and/or coordination of care.   ------------------------------------------------  Sheral Apley. Tammi Klippel, M.D.  This document serves as a record of services personally performed by Tyler Pita, MD. It was created on his behalf by Darcus Austin, a trained medical scribe. The creation of this record is based on the scribe's personal observations and the provider's statements to them. This document has been checked and approved by the attending provider.

## 2015-10-19 NOTE — Telephone Encounter (Signed)
CALLED PATIENT TO INFORM OF BONE SCAN ON 10-26-15 - ARRIVAL TIME - 10:45 AM, NO RESTRICTIONS TO TEST, SPOKE WITH PATIENT AND HE IS AWARE OF THIS TEST

## 2015-10-19 NOTE — Progress Notes (Signed)
See progress note under physician encounter. 

## 2015-10-20 LAB — PSA: Prostate Specific Ag, Serum: 24.8 ng/mL — ABNORMAL HIGH (ref 0.0–4.0)

## 2015-10-24 ENCOUNTER — Telehealth: Payer: Self-pay | Admitting: Radiation Oncology

## 2015-10-24 NOTE — Telephone Encounter (Signed)
-----   Message from Tyler Pita, MD sent at 10/23/2015 12:27 PM EDT ----- Regarding: ) Sam or Bryson Ha,  Please call patient with lower PSA result.  We are waiting 2nd opinion consult at [facility] from a urologist (not Gaynelle Arabian at patient request - Enid Derry setting up?).  I need follow-up with him after.  MM    ----- Message -----    From: [facility] Interface    Sent: 10/20/2015   4:40 AM      To: Tyler Pita, MD

## 2015-10-24 NOTE — Telephone Encounter (Signed)
Phoned patient per Dr. Johny Shears order. Informed him of his PSA result. Patient happy to hear his "PSA is half of what it was." Patient reports he is scheduled for a bone scan on Wednesday. Explained Enid Derry is still working with Danae Chen at [facility] to get him set up for that second opinion and will call once arrangements are in place. Patient understands he will be seen by Dr. Tammi Klippel following [facility] visit. Patient expressed appreciation for the call.

## 2015-10-26 ENCOUNTER — Ambulatory Visit (HOSPITAL_COMMUNITY)
Admission: RE | Admit: 2015-10-26 | Discharge: 2015-10-26 | Disposition: A | Discharge status: Home / Self Care | Payer: Medicare Other | Source: Ambulatory Visit | Attending: Radiation Oncology | Admitting: Radiation Oncology

## 2015-10-26 ENCOUNTER — Encounter (HOSPITAL_COMMUNITY)
Admission: RE | Admit: 2015-10-26 | Discharge: 2015-10-26 | Disposition: A | Discharge status: Home / Self Care | Payer: Medicare Other | Source: Ambulatory Visit | Attending: Radiation Oncology | Admitting: Radiation Oncology

## 2015-10-26 DIAGNOSIS — C61 Malignant neoplasm of prostate: Secondary | ICD-10-CM | POA: Insufficient documentation

## 2015-10-26 MED ORDER — TECHNETIUM TC 99M MEDRONATE IV KIT
24.6000 | PACK | Freq: Once | INTRAVENOUS | Status: AC | PRN
  Administered 2015-10-26: 24.6 via INTRAVENOUS

## 2015-10-27 ENCOUNTER — Telehealth: Payer: Self-pay | Admitting: Radiation Oncology

## 2015-10-27 ENCOUNTER — Telehealth: Payer: Self-pay | Admitting: *Deleted

## 2015-10-27 NOTE — Telephone Encounter (Signed)
CALLED PATIENT TO INFORM OF GOLD SEED PLACEMENT ON 11/04/15 @ 2:15 PM @ DR. Lyndal Rainbow OFFICE AND HIS SIM ON 11-11-15 @ 1 PM @ DR. MANNING'S OFFICE, SPOKE WITH PATIENT AND HE IS AWARE OF THESE APPTS.

## 2015-10-27 NOTE — Telephone Encounter (Signed)
Phoned patient per [name], PA-C order and explained his bone scan results are negative for metastatic disease. Patient verbalized understanding and expressed appreciation for the call. Vincent Schneider committed to call patient today with an update about appointment with [facility].

## 2015-11-09 NOTE — Progress Notes (Deleted)
  Radiation Oncology         [phone] ________________________________  Name: SAMANTHA PIETRZYK MRN: [id number]  Date: 11/11/2015  DOB: 11-Jun-1940  SIMULATION AND TREATMENT PLANNING NOTE    ICD-9-CM ICD-10-CM   1. Malignant neoplasm of prostate ([place]) 185 C61     DIAGNOSIS:  75 y.o. gentleman with stage T1c adenocarcinoma of the prostate with a Gleason's score of 4+3 and a PSA of 43.66.  NARRATIVE:  The patient was brought to the [facility].  Identity was confirmed.  All relevant records and images related to the planned course of therapy were reviewed.  The patient freely provided informed written consent to proceed with treatment after reviewing the details related to the planned course of therapy. The consent form was witnessed and verified by the simulation staff.  Then, the patient was set-up in a stable reproducible supine position for radiation therapy.  A vacuum lock pillow device was custom fabricated to position his legs in a reproducible immobilized position.  Then, I performed a urethrogram under sterile conditions to identify the prostatic apex.  CT images were obtained.  Surface markings were placed.  The CT images were loaded into the planning software.  Then the prostate target and avoidance structures including the rectum, bladder, bowel and hips were contoured.  Treatment planning then occurred.  The radiation prescription was entered and confirmed.  A total of 1 complex treatment device was fabricated. I have requested : Intensity Modulated Radiotherapy (IMRT) is medically necessary for this case for the following reason:  Rectal sparing.Marland Kitchen  PLAN:  The patient will receive 75 Gy in 40 fractions treating pelvic nodes to 45 Gy and boosting the prostate.  ________________________________  Sheral Apley. Tammi Klippel, M.D.

## 2015-11-11 ENCOUNTER — Ambulatory Visit
Admission: RE | Admit: 2015-11-11 | Discharge: 2015-11-11 | Disposition: A | Discharge status: Home / Self Care | Payer: Medicare Other | Source: Ambulatory Visit | Attending: Radiation Oncology | Admitting: Radiation Oncology

## 2015-11-11 DIAGNOSIS — C61 Malignant neoplasm of prostate: Secondary | ICD-10-CM

## 2015-11-22 ENCOUNTER — Ambulatory Visit: Payer: Medicare Other | Admitting: Radiation Oncology

## 2015-11-23 ENCOUNTER — Ambulatory Visit: Payer: Medicare Other

## 2015-11-24 ENCOUNTER — Ambulatory Visit: Payer: Medicare Other

## 2015-11-25 ENCOUNTER — Ambulatory Visit: Payer: Medicare Other

## 2015-11-28 ENCOUNTER — Ambulatory Visit: Payer: Medicare Other

## 2015-11-29 ENCOUNTER — Ambulatory Visit: Payer: Medicare Other

## 2015-11-30 ENCOUNTER — Ambulatory Visit: Payer: Medicare Other

## 2015-12-01 ENCOUNTER — Ambulatory Visit: Payer: Medicare Other

## 2015-12-02 ENCOUNTER — Ambulatory Visit: Payer: Medicare Other

## 2015-12-05 ENCOUNTER — Ambulatory Visit: Payer: Medicare Other

## 2015-12-06 ENCOUNTER — Ambulatory Visit: Payer: Medicare Other

## 2015-12-07 ENCOUNTER — Ambulatory Visit: Payer: Medicare Other

## 2015-12-08 ENCOUNTER — Ambulatory Visit: Payer: Medicare Other

## 2015-12-09 ENCOUNTER — Ambulatory Visit: Payer: Medicare Other

## 2015-12-13 ENCOUNTER — Ambulatory Visit: Payer: Medicare Other

## 2015-12-14 ENCOUNTER — Ambulatory Visit: Payer: Medicare Other

## 2015-12-15 ENCOUNTER — Ambulatory Visit: Payer: Medicare Other

## 2015-12-16 ENCOUNTER — Ambulatory Visit: Payer: Medicare Other

## 2015-12-19 ENCOUNTER — Ambulatory Visit: Payer: Medicare Other

## 2015-12-20 ENCOUNTER — Ambulatory Visit: Payer: Medicare Other

## 2015-12-21 ENCOUNTER — Ambulatory Visit: Payer: Medicare Other

## 2015-12-22 ENCOUNTER — Ambulatory Visit: Payer: Medicare Other

## 2015-12-23 ENCOUNTER — Ambulatory Visit: Payer: Medicare Other

## 2015-12-23 ENCOUNTER — Ambulatory Visit
Admission: RE | Admit: 2015-12-23 | Discharge: 2015-12-23 | Disposition: A | Discharge status: Home / Self Care | Payer: Medicare Other | Source: Ambulatory Visit | Attending: Radiation Oncology | Admitting: Radiation Oncology

## 2015-12-23 DIAGNOSIS — Z510 Encounter for antineoplastic radiation therapy: Secondary | ICD-10-CM | POA: Diagnosis not present

## 2015-12-23 DIAGNOSIS — C61 Malignant neoplasm of prostate: Secondary | ICD-10-CM

## 2015-12-23 NOTE — Progress Notes (Signed)
  Radiation Oncology         [phone] ________________________________  Name: Vincent Schneider MRN: [id number]  Date: 12/23/2015  DOB: [birth year]  SIMULATION AND TREATMENT PLANNING NOTE    ICD-9-CM ICD-10-CM   1. Malignant neoplasm of prostate ([place]) 185 C61     DIAGNOSIS:  75 y.o. gentleman with stage T1c adenocarcinoma of the prostate with a Gleason's score of 4+3 and a PSA of 43.66.  NARRATIVE:  The patient was brought to the [facility].  Identity was confirmed.  All relevant records and images related to the planned course of therapy were reviewed.  The patient freely provided informed written consent to proceed with treatment after reviewing the details related to the planned course of therapy. The consent form was witnessed and verified by the simulation staff.  Then, the patient was set-up in a stable reproducible supine position for radiation therapy.  A vacuum lock pillow device was custom fabricated to position his legs in a reproducible immobilized position.  Then, I performed a urethrogram under sterile conditions to identify the prostatic apex.  CT images were obtained.  Surface markings were placed.  The CT images were loaded into the planning software.  Then the prostate target and avoidance structures including the rectum, bladder, bowel and hips were contoured.  Treatment planning then occurred.  The radiation prescription was entered and confirmed.  A total of 1 complex treatment device was fabricated. I have requested : Intensity Modulated Radiotherapy (IMRT) is medically necessary for this case for the following reason:  Rectal sparing.Marland Kitchen  PLAN:  The patient will receive 75 Gy in 40 fractions treating pelvic nodes to 45 Gy and boosting the prostate.  ________________________________  Sheral Apley. Tammi Klippel, M.D.  This document serves as a record of services personally performed by Tyler Pita, MD. It was created on his behalf by Darcus Austin, a trained  medical scribe. The creation of this record is based on the scribe's personal observations and the provider's statements to them. This document has been checked and approved by the attending provider.

## 2015-12-26 ENCOUNTER — Ambulatory Visit: Payer: Medicare Other

## 2015-12-27 ENCOUNTER — Ambulatory Visit: Payer: Medicare Other

## 2015-12-28 ENCOUNTER — Ambulatory Visit: Payer: Medicare Other

## 2015-12-29 ENCOUNTER — Ambulatory Visit: Payer: Medicare Other

## 2015-12-30 ENCOUNTER — Ambulatory Visit: Payer: Medicare Other

## 2015-12-30 DIAGNOSIS — Z510 Encounter for antineoplastic radiation therapy: Secondary | ICD-10-CM | POA: Diagnosis not present

## 2016-01-02 ENCOUNTER — Ambulatory Visit: Payer: Medicare Other | Admitting: Radiation Oncology

## 2016-01-02 ENCOUNTER — Ambulatory Visit: Payer: Medicare Other

## 2016-01-03 ENCOUNTER — Ambulatory Visit: Payer: Medicare Other

## 2016-01-03 ENCOUNTER — Ambulatory Visit
Admission: RE | Admit: 2016-01-03 | Discharge: 2016-01-03 | Disposition: A | Discharge status: Home / Self Care | Payer: Medicare Other | Source: Ambulatory Visit | Attending: Radiation Oncology | Admitting: Radiation Oncology

## 2016-01-03 DIAGNOSIS — Z510 Encounter for antineoplastic radiation therapy: Secondary | ICD-10-CM | POA: Diagnosis not present

## 2016-01-04 ENCOUNTER — Ambulatory Visit
Admission: RE | Admit: 2016-01-04 | Discharge: 2016-01-04 | Disposition: A | Discharge status: Home / Self Care | Payer: Medicare Other | Source: Ambulatory Visit | Attending: Radiation Oncology | Admitting: Radiation Oncology

## 2016-01-04 ENCOUNTER — Ambulatory Visit: Payer: Medicare Other

## 2016-01-04 DIAGNOSIS — Z510 Encounter for antineoplastic radiation therapy: Secondary | ICD-10-CM | POA: Diagnosis not present

## 2016-01-05 ENCOUNTER — Ambulatory Visit: Payer: Medicare Other

## 2016-01-05 ENCOUNTER — Ambulatory Visit
Admission: RE | Admit: 2016-01-05 | Discharge: 2016-01-05 | Disposition: A | Discharge status: Home / Self Care | Payer: Medicare Other | Source: Ambulatory Visit | Attending: Radiation Oncology | Admitting: Radiation Oncology

## 2016-01-05 DIAGNOSIS — Z510 Encounter for antineoplastic radiation therapy: Secondary | ICD-10-CM | POA: Diagnosis not present

## 2016-01-06 ENCOUNTER — Ambulatory Visit: Payer: Medicare Other

## 2016-01-06 ENCOUNTER — Ambulatory Visit
Admission: RE | Admit: 2016-01-06 | Discharge: 2016-01-06 | Disposition: A | Discharge status: Home / Self Care | Payer: Medicare Other | Source: Ambulatory Visit | Attending: Radiation Oncology | Admitting: Radiation Oncology

## 2016-01-06 VITALS — BP 152/94 | HR 60 | Temp 98.3°F | Resp 18 | Ht 75.0 in | Wt 194.3 lb

## 2016-01-06 DIAGNOSIS — C61 Malignant neoplasm of prostate: Secondary | ICD-10-CM

## 2016-01-06 DIAGNOSIS — Z510 Encounter for antineoplastic radiation therapy: Secondary | ICD-10-CM | POA: Diagnosis not present

## 2016-01-06 NOTE — Progress Notes (Signed)
Vital signs are stable.:  Intermittent urine stream, occasional urinary urgency and weak stream, reports nocturia x 3.  Denies  leakage and post void dribble. Denies dysuria or hematuria  And diarrhea.   Having hot flashes. Patient confirms taking Flomax as directed. Wt Readings from Last 3 Encounters:  01/06/16 194 lb 4.8 oz (88.1 kg)  10/19/15 192 lb 12.8 oz (87.5 kg)  12/21/14 188 lb (85.3 kg)  BP (!) 152/94 (BP Location: Left Arm, Patient Position: Sitting, Cuff Size: Normal)   Pulse 60   Temp 98.3 F (36.8 C) (Oral)   Resp 18   Ht 6\' 3"  (1.905 m)   Wt 194 lb 4.8 oz (88.1 kg)   BMI 24.29 kg/m

## 2016-01-06 NOTE — Progress Notes (Signed)
  Radiation Oncology         [phone]   Name: Vincent Schneider MRN: [id number]   Date: 01/06/2016  DOB: [birth year]   Weekly Radiation Therapy Management    ICD-9-CM ICD-10-CM   1. Malignant neoplasm of prostate ([place]) 185 C61     Current Dose: 7.2 Gy  Planned Dose:  75 Gy  Narrative The patient presents for routine under treatment assessment. The patient reports an intermittent urine stream, occasional urinary urgency, a weak stream, nocturia x 3, and hot flashes. Denies leakage, post void dribble, dysuria, hematuria, or diarrhea. Patient confirms taking Flomax as directed. Set-up films were reviewed. The chart was checked.  Physical Findings  height is 6\' 3"  (1.905 m) and weight is 194 lb 4.8 oz (88.1 kg). His oral temperature is 98.3 F (36.8 C). His blood pressure is 152/94 (abnormal) and his pulse is 60. His respiration is 18. . Weight essentially stable.  No significant changes.  Impression The patient is tolerating radiation.  Plan Continue treatment as planned.     Sheral Apley Tammi Klippel, M.D.  This document serves as a record of services personally performed by Tyler Pita, MD. It was created on his behalf by Darcus Austin, a trained medical scribe. The creation of this record is based on the scribe's personal observations and the provider's statements to them. This document has been checked and approved by the attending provider.

## 2016-01-09 ENCOUNTER — Ambulatory Visit
Admission: RE | Admit: 2016-01-09 | Discharge: 2016-01-09 | Disposition: A | Discharge status: Home / Self Care | Payer: Medicare Other | Source: Ambulatory Visit | Attending: Radiation Oncology | Admitting: Radiation Oncology

## 2016-01-09 ENCOUNTER — Ambulatory Visit: Payer: Medicare Other

## 2016-01-09 DIAGNOSIS — C61 Malignant neoplasm of prostate: Secondary | ICD-10-CM | POA: Diagnosis present

## 2016-01-09 DIAGNOSIS — E785 Hyperlipidemia, unspecified: Secondary | ICD-10-CM | POA: Diagnosis not present

## 2016-01-09 DIAGNOSIS — Z79899 Other long term (current) drug therapy: Secondary | ICD-10-CM | POA: Diagnosis not present

## 2016-01-09 DIAGNOSIS — Z510 Encounter for antineoplastic radiation therapy: Secondary | ICD-10-CM | POA: Diagnosis not present

## 2016-01-09 DIAGNOSIS — H409 Unspecified glaucoma: Secondary | ICD-10-CM | POA: Diagnosis not present

## 2016-01-09 DIAGNOSIS — F419 Anxiety disorder, unspecified: Secondary | ICD-10-CM | POA: Diagnosis not present

## 2016-01-09 DIAGNOSIS — Z7982 Long term (current) use of aspirin: Secondary | ICD-10-CM | POA: Diagnosis not present

## 2016-01-10 ENCOUNTER — Ambulatory Visit: Payer: Medicare Other

## 2016-01-10 ENCOUNTER — Ambulatory Visit
Admission: RE | Admit: 2016-01-10 | Discharge: 2016-01-10 | Disposition: A | Discharge status: Home / Self Care | Payer: Medicare Other | Source: Ambulatory Visit | Attending: Radiation Oncology | Admitting: Radiation Oncology

## 2016-01-10 DIAGNOSIS — Z510 Encounter for antineoplastic radiation therapy: Secondary | ICD-10-CM | POA: Diagnosis not present

## 2016-01-11 ENCOUNTER — Ambulatory Visit: Payer: Medicare Other

## 2016-01-11 ENCOUNTER — Ambulatory Visit
Admission: RE | Admit: 2016-01-11 | Discharge: 2016-01-11 | Disposition: A | Discharge status: Home / Self Care | Payer: Medicare Other | Source: Ambulatory Visit | Attending: Radiation Oncology | Admitting: Radiation Oncology

## 2016-01-11 DIAGNOSIS — Z510 Encounter for antineoplastic radiation therapy: Secondary | ICD-10-CM | POA: Diagnosis not present

## 2016-01-12 ENCOUNTER — Ambulatory Visit: Payer: Medicare Other

## 2016-01-12 ENCOUNTER — Ambulatory Visit
Admission: RE | Admit: 2016-01-12 | Discharge: 2016-01-12 | Disposition: A | Discharge status: Home / Self Care | Payer: Medicare Other | Source: Ambulatory Visit | Attending: Radiation Oncology | Admitting: Radiation Oncology

## 2016-01-12 DIAGNOSIS — Z510 Encounter for antineoplastic radiation therapy: Secondary | ICD-10-CM | POA: Diagnosis not present

## 2016-01-13 ENCOUNTER — Ambulatory Visit: Payer: Medicare Other

## 2016-01-13 ENCOUNTER — Ambulatory Visit: Admission: RE | Admit: 2016-01-13 | Payer: Medicare Other | Source: Ambulatory Visit | Admitting: Radiation Oncology

## 2016-01-16 ENCOUNTER — Ambulatory Visit: Payer: Medicare Other

## 2016-01-16 ENCOUNTER — Ambulatory Visit
Admission: RE | Admit: 2016-01-16 | Discharge: 2016-01-16 | Disposition: A | Discharge status: Home / Self Care | Payer: Medicare Other | Source: Ambulatory Visit | Attending: Radiation Oncology | Admitting: Radiation Oncology

## 2016-01-16 DIAGNOSIS — Z510 Encounter for antineoplastic radiation therapy: Secondary | ICD-10-CM | POA: Diagnosis not present

## 2016-01-17 ENCOUNTER — Ambulatory Visit
Admission: RE | Admit: 2016-01-17 | Discharge: 2016-01-17 | Disposition: A | Discharge status: Home / Self Care | Payer: Medicare Other | Source: Ambulatory Visit | Attending: Radiation Oncology | Admitting: Radiation Oncology

## 2016-01-17 ENCOUNTER — Encounter: Payer: Self-pay | Admitting: Radiation Oncology

## 2016-01-17 ENCOUNTER — Ambulatory Visit: Payer: Medicare Other

## 2016-01-17 VITALS — BP 140/101 | HR 66 | Temp 98.9°F | Resp 18 | Ht 75.0 in | Wt 191.8 lb

## 2016-01-17 DIAGNOSIS — C61 Malignant neoplasm of prostate: Secondary | ICD-10-CM | POA: Diagnosis present

## 2016-01-17 DIAGNOSIS — Z510 Encounter for antineoplastic radiation therapy: Secondary | ICD-10-CM | POA: Insufficient documentation

## 2016-01-17 NOTE — Progress Notes (Addendum)
Vital signs are stable.: Intermittent urine stream, occasional urinary urgency and weak stream, reports nocturia x 4.  Denies  leakage and post void dribble. Denies dysuria or hematuria.  Reports diarrhea stools last Thursday and Friday.  No nausea or vomiting.  Having hot flashes.  Reports feeling very fatigue last week. Wt Readings from Last 3 Encounters:  01/06/16 194 lb 4.8 oz (88.1 kg)  10/19/15 192 lb 12.8 oz (87.5 kg)  12/21/14 188 lb (85.3 kg)  BP (!) 147/103 (BP Location: Right Arm, Patient Position: Sitting, Cuff Size: Normal)   Pulse 66   Temp 98.9 F (37.2 C) (Oral)   Resp 18   Ht 6\' 3"  (1.905 m)   Wt 191 lb 12.8 oz (87 kg)   SpO2 100%   BMI 23.97 kg/m   BP (!) 140/101 (BP Location: Left Arm, Patient Position: Sitting, Cuff Size: Normal)   Pulse 66   Temp 98.9 F (37.2 C) (Oral)   Resp 18   Ht 6\' 3"  (1.905 m)   Wt 191 lb 12.8 oz (87 kg)   SpO2 100%   BMI 23.97 kg/m

## 2016-01-17 NOTE — Progress Notes (Signed)
   Weekly Management Note:  outpatient    ICD-9-CM ICD-10-CM   1. Malignant neoplasm of prostate (HCC) 185 C61     Current Dose:  18 Gy  Projected Dose: 75 Gy   Narrative:  The patient presents for routine under treatment assessment.  CBCT/MVCT images/Port film x-rays were reviewed.  The chart was checked. Intermittent urine stream, occasional urinary urgency and weak stream, reports nocturia x 4 which is stable. Reports taking flomax.  Denies  leakage and post void dribble. Denies dysuria or hematuria.  Reports diarrhea stools last Thursday and Friday.  No nausea or vomiting.  Reports he has informed Dr Tammi Klippel of rectal bleeding after his initial several treatments which has resolved.  Takes a baby ASA. Denies other blood thinners. Having hot flashes.  Reports feeling very fatigued last week.  Physical Findings:  height is 6\' 3"  (1.905 m) and weight is 191 lb 12.8 oz (87 kg). His oral temperature is 98.9 F (37.2 C). His blood pressure is 140/101 (abnormal) and his pulse is 66. His respiration is 18 and oxygen saturation is 100%.   Wt Readings from Last 3 Encounters:  01/17/16 191 lb 12.8 oz (87 kg)  01/06/16 194 lb 4.8 oz (88.1 kg)  10/19/15 192 lb 12.8 oz (87.5 kg)   NAD well appearing  Impression:  The patient is tolerating radiotherapy.  Plan:  Continue radiotherapy as planned. Due to "twisted colon" he hesitates to take Imodium which is understandable.  Currently he is content with his symptoms. Will discuss GI symptoms again later this week w/ Dr Tammi Klippel.  ________________________________   Eppie Gibson, M.D.

## 2016-01-18 ENCOUNTER — Ambulatory Visit: Payer: Medicare Other

## 2016-01-18 ENCOUNTER — Ambulatory Visit
Admission: RE | Admit: 2016-01-18 | Discharge: 2016-01-18 | Disposition: A | Discharge status: Home / Self Care | Payer: Medicare Other | Source: Ambulatory Visit | Attending: Radiation Oncology | Admitting: Radiation Oncology

## 2016-01-18 DIAGNOSIS — Z510 Encounter for antineoplastic radiation therapy: Secondary | ICD-10-CM | POA: Diagnosis not present

## 2016-01-19 ENCOUNTER — Ambulatory Visit
Admission: RE | Admit: 2016-01-19 | Discharge: 2016-01-19 | Disposition: A | Discharge status: Home / Self Care | Payer: Medicare Other | Source: Ambulatory Visit | Attending: Radiation Oncology | Admitting: Radiation Oncology

## 2016-01-19 ENCOUNTER — Ambulatory Visit: Payer: Medicare Other

## 2016-01-19 DIAGNOSIS — Z510 Encounter for antineoplastic radiation therapy: Secondary | ICD-10-CM | POA: Diagnosis not present

## 2016-01-20 ENCOUNTER — Ambulatory Visit
Admission: RE | Admit: 2016-01-20 | Discharge: 2016-01-20 | Disposition: A | Discharge status: Home / Self Care | Payer: Medicare Other | Source: Ambulatory Visit | Attending: Radiation Oncology | Admitting: Radiation Oncology

## 2016-01-20 ENCOUNTER — Ambulatory Visit: Payer: Medicare Other

## 2016-01-20 VITALS — BP 144/96 | HR 77 | Resp 16 | Wt 194.8 lb

## 2016-01-20 DIAGNOSIS — C61 Malignant neoplasm of prostate: Secondary | ICD-10-CM

## 2016-01-20 DIAGNOSIS — Z510 Encounter for antineoplastic radiation therapy: Secondary | ICD-10-CM | POA: Diagnosis not present

## 2016-01-20 NOTE — Progress Notes (Signed)
  Radiation Oncology         [phone]   Name: Vincent Schneider MRN: [id number]   Date: 01/20/2016  DOB: 07-Apr-1941   Weekly Radiation Therapy Management    ICD-9-CM ICD-10-CM   1. Malignant neoplasm of prostate ([place]) 185 C61     Current Dose: 23.4 Gy  Planned Dose:  75 Gy  Narrative The patient presents for routine under treatment assessment.  Weight stable. BP elevated but improved from last PUT. Denies pain. Reports nocturia x 4. Reports hot flashes due to Lupron received last on 11/04/15. Reports intermittent discomfort with urination. Denies hematuria. Reports blood in stool October 5 until October 7th and at its worst on the 6th. Denies any blood in his stool since then. Reports BM today was formed. Reports urinary urgency and occasional leakage. Reports fatigue. Reports occasional difficulty emptying his bladder at night despite flomax and holding fluids two hours prior to bed. Describes a strong steady urine stream in the AM that weakens as the day progresses.   Set-up films were reviewed. The chart was checked.  Physical Findings  weight is 194 lb 12.8 oz (88.4 kg). His blood pressure is 144/96 (abnormal) and his pulse is 77. His respiration is 16 and oxygen saturation is 100%. . Weight essentially stable.  No significant changes.  Impression The patient is tolerating radiation.  Plan Continue treatment as planned.  F/U with PCP re: HTN     Bernadene Garside A. Tammi Klippel, M.D.  This document serves as a record of services personally performed by Vincent Pita, MD. It was created on his behalf by Vincent Schneider, a trained medical scribe. The creation of this record is based on the scribe's personal observations and the provider's statements to them. This document has been checked and approved by the attending provider.

## 2016-01-20 NOTE — Progress Notes (Signed)
Weight stable. BP elevated but, improved from last PUT. Denies pain. Reports nocturia x 4. Reports hot flashes due to Lupron received last on 11/04/15. Reports intermittent discomfort with urination. Denies hematuria. Reports blood in stool October 5 until October 7th and at its worst on the 6th. Denies any blood in his stool since then. Reports BM today was formed. Reports urinary urgency and occasional leakage. Reports fatigue. Reports occasional difficulty emptying his bladder at night despite flomax and holding fluids two hours prior to bed. Describes a strong steady urine stream in the AM that weakens as the day progresses.   BP (!) 144/96 (BP Location: Left Arm, Patient Position: Sitting, Cuff Size: Normal)   Pulse 77   Resp 16   Wt 194 lb 12.8 oz (88.4 kg)   SpO2 100%   BMI 24.35 kg/m  Wt Readings from Last 3 Encounters:  01/20/16 194 lb 12.8 oz (88.4 kg)  01/17/16 191 lb 12.8 oz (87 kg)  01/06/16 194 lb 4.8 oz (88.1 kg)

## 2016-01-23 ENCOUNTER — Ambulatory Visit
Admission: RE | Admit: 2016-01-23 | Discharge: 2016-01-23 | Disposition: A | Discharge status: Home / Self Care | Payer: Medicare Other | Source: Ambulatory Visit | Attending: Radiation Oncology | Admitting: Radiation Oncology

## 2016-01-23 ENCOUNTER — Ambulatory Visit: Payer: Medicare Other

## 2016-01-23 DIAGNOSIS — Z510 Encounter for antineoplastic radiation therapy: Secondary | ICD-10-CM | POA: Diagnosis not present

## 2016-01-24 ENCOUNTER — Ambulatory Visit: Payer: Medicare Other

## 2016-01-24 ENCOUNTER — Ambulatory Visit
Admission: RE | Admit: 2016-01-24 | Discharge: 2016-01-24 | Disposition: A | Discharge status: Home / Self Care | Payer: Medicare Other | Source: Ambulatory Visit | Attending: Radiation Oncology | Admitting: Radiation Oncology

## 2016-01-24 DIAGNOSIS — Z510 Encounter for antineoplastic radiation therapy: Secondary | ICD-10-CM | POA: Diagnosis not present

## 2016-01-25 ENCOUNTER — Ambulatory Visit
Admission: RE | Admit: 2016-01-25 | Discharge: 2016-01-25 | Disposition: A | Discharge status: Home / Self Care | Payer: Medicare Other | Source: Ambulatory Visit | Attending: Radiation Oncology | Admitting: Radiation Oncology

## 2016-01-25 ENCOUNTER — Ambulatory Visit: Payer: Medicare Other

## 2016-01-25 DIAGNOSIS — Z510 Encounter for antineoplastic radiation therapy: Secondary | ICD-10-CM | POA: Diagnosis not present

## 2016-01-26 ENCOUNTER — Ambulatory Visit: Payer: Medicare Other

## 2016-01-26 ENCOUNTER — Encounter: Payer: Self-pay | Admitting: Radiation Oncology

## 2016-01-26 ENCOUNTER — Ambulatory Visit
Admission: RE | Admit: 2016-01-26 | Discharge: 2016-01-26 | Disposition: A | Discharge status: Home / Self Care | Payer: Medicare Other | Source: Ambulatory Visit | Attending: Radiation Oncology | Admitting: Radiation Oncology

## 2016-01-26 VITALS — BP 127/82 | HR 65 | Temp 98.3°F | Ht 75.0 in | Wt 192.4 lb

## 2016-01-26 DIAGNOSIS — Z510 Encounter for antineoplastic radiation therapy: Secondary | ICD-10-CM | POA: Diagnosis not present

## 2016-01-26 DIAGNOSIS — C61 Malignant neoplasm of prostate: Secondary | ICD-10-CM

## 2016-01-26 NOTE — Progress Notes (Signed)
  Radiation Oncology         [phone]   Name: Vincent Schneider MRN: [id number]   Date: 01/26/2016  DOB: [birth year]   Weekly Radiation Therapy Management    ICD-9-CM ICD-10-CM   1. Malignant neoplasm of prostate ([place]) 185 C61     Current Dose: 30.6 Gy  Planned Dose:  75 Gy  Narrative The patient presents for routine under treatment assessment.  Vincent Schneider has completed 17 fractions to his prostate.  He reports having some discomfort with urination that started a week ago.  He continues to report having urinary urgency.  He denies having hematuria.  He reports nocturia x 3-4.  He reports having diarrhea Monday and Tuesday.  He took Imodium and has not had any since.  He reports having fatigue.  He denies having any skin irritation.   Set-up films were reviewed. The chart was checked.  Physical Findings  height is 6\' 3"  (1.905 m) and weight is 192 lb 6.4 oz (87.3 kg). His oral temperature is 98.3 F (36.8 C). His blood pressure is 127/82 and his pulse is 65. His oxygen saturation is 100%. . Weight essentially stable.  No significant changes.  Impression The patient is tolerating radiation.  Plan Continue treatment as planned.       Sheral Apley Tammi Klippel, M.D.  This document serves as a record of services personally performed by Tyler Pita, MD. It was created on his behalf by Arlyce Harman, a trained medical scribe. The creation of this record is based on the scribe's personal observations and the provider's statements to them. This document has been checked and approved by the attending provider.

## 2016-01-26 NOTE — Progress Notes (Signed)
Vincent Schneider has completed 17 fractions to his prostate.  He reports having some discomfort with urination that started a week ago.  He continues to report having urinary urgency.  He denies having hematuria.  He reports nocturia x 3-4.  He reports having diarrhea Monday and Tuesday.  He took Imodium and has not had any since.  He reports having fatigue.  He denies having any skin irritation.  BP 127/82 (BP Location: Right Arm, Patient Position: Sitting)   Pulse 65   Temp 98.3 F (36.8 C) (Oral)   Ht 6\' 3"  (1.905 m)   Wt 192 lb 6.4 oz (87.3 kg)   SpO2 100%   BMI 24.05 kg/m    Wt Readings from Last 3 Encounters:  01/26/16 192 lb 6.4 oz (87.3 kg)  01/20/16 194 lb 12.8 oz (88.4 kg)  01/17/16 191 lb 12.8 oz (87 kg)

## 2016-01-27 ENCOUNTER — Ambulatory Visit
Admission: RE | Admit: 2016-01-27 | Discharge: 2016-01-27 | Disposition: A | Discharge status: Home / Self Care | Payer: Medicare Other | Source: Ambulatory Visit | Attending: Radiation Oncology | Admitting: Radiation Oncology

## 2016-01-27 ENCOUNTER — Ambulatory Visit: Payer: Medicare Other

## 2016-01-27 DIAGNOSIS — Z510 Encounter for antineoplastic radiation therapy: Secondary | ICD-10-CM | POA: Diagnosis not present

## 2016-01-30 ENCOUNTER — Ambulatory Visit
Admission: RE | Admit: 2016-01-30 | Discharge: 2016-01-30 | Disposition: A | Discharge status: Home / Self Care | Payer: Medicare Other | Source: Ambulatory Visit | Attending: Radiation Oncology | Admitting: Radiation Oncology

## 2016-01-30 ENCOUNTER — Ambulatory Visit: Payer: Medicare Other

## 2016-01-30 DIAGNOSIS — Z510 Encounter for antineoplastic radiation therapy: Secondary | ICD-10-CM | POA: Diagnosis not present

## 2016-01-31 ENCOUNTER — Ambulatory Visit: Payer: Medicare Other

## 2016-01-31 ENCOUNTER — Ambulatory Visit
Admission: RE | Admit: 2016-01-31 | Discharge: 2016-01-31 | Disposition: A | Discharge status: Home / Self Care | Payer: Medicare Other | Source: Ambulatory Visit | Attending: Radiation Oncology | Admitting: Radiation Oncology

## 2016-01-31 DIAGNOSIS — Z510 Encounter for antineoplastic radiation therapy: Secondary | ICD-10-CM | POA: Diagnosis not present

## 2016-02-01 ENCOUNTER — Ambulatory Visit
Admission: RE | Admit: 2016-02-01 | Discharge: 2016-02-01 | Disposition: A | Discharge status: Home / Self Care | Payer: Medicare Other | Source: Ambulatory Visit | Attending: Radiation Oncology | Admitting: Radiation Oncology

## 2016-02-01 ENCOUNTER — Ambulatory Visit: Payer: Medicare Other

## 2016-02-01 DIAGNOSIS — Z510 Encounter for antineoplastic radiation therapy: Secondary | ICD-10-CM | POA: Diagnosis not present

## 2016-02-02 ENCOUNTER — Ambulatory Visit: Payer: Medicare Other

## 2016-02-02 ENCOUNTER — Ambulatory Visit
Admission: RE | Admit: 2016-02-02 | Discharge: 2016-02-02 | Disposition: A | Discharge status: Home / Self Care | Payer: Medicare Other | Source: Ambulatory Visit | Attending: Radiation Oncology | Admitting: Radiation Oncology

## 2016-02-02 DIAGNOSIS — Z510 Encounter for antineoplastic radiation therapy: Secondary | ICD-10-CM | POA: Diagnosis not present

## 2016-02-03 ENCOUNTER — Ambulatory Visit
Admission: RE | Admit: 2016-02-03 | Discharge: 2016-02-03 | Disposition: A | Discharge status: Home / Self Care | Payer: Medicare Other | Source: Ambulatory Visit | Attending: Radiation Oncology | Admitting: Radiation Oncology

## 2016-02-03 ENCOUNTER — Ambulatory Visit: Payer: Medicare Other

## 2016-02-03 ENCOUNTER — Encounter: Payer: Self-pay | Admitting: Radiation Oncology

## 2016-02-03 VITALS — BP 157/96 | HR 63 | Temp 98.2°F | Resp 18 | Ht 75.0 in | Wt 192.0 lb

## 2016-02-03 DIAGNOSIS — Z510 Encounter for antineoplastic radiation therapy: Secondary | ICD-10-CM | POA: Diagnosis not present

## 2016-02-03 DIAGNOSIS — C61 Malignant neoplasm of prostate: Secondary | ICD-10-CM

## 2016-02-03 NOTE — Progress Notes (Signed)
Vincent Schneider has completed 23 fractions to his prostate.  He reports having some discomfort with urination that started a week ago.  He continues to report having urinary urgency.  He denies having hematuria.  He reports nocturia x 3-4.  He reports having diarrhea..  He has not taken any  Imodium this week.  He reports having fatigue.  He denies having any skin irritation. Wt Readings from Last 3 Encounters:  02/03/16 192 lb (87.1 kg)  01/26/16 192 lb 6.4 oz (87.3 kg)  01/20/16 194 lb 12.8 oz (88.4 kg)  BP (!) 157/96   Pulse 63   Temp 98.2 F (36.8 C) (Oral)   Resp 18   Ht 6\' 3"  (1.905 m)   Wt 192 lb (87.1 kg)   SpO2 100%   BMI 24.00 kg/m

## 2016-02-03 NOTE — Progress Notes (Signed)
  Radiation Oncology         [phone]   Name: Vincent Schneider MRN: [id number]   Date: 02/03/2016  DOB: 1940-04-11   Weekly Radiation Therapy Management    ICD-9-CM ICD-10-CM   1. Malignant neoplasm of prostate ([facility]) 185 C61     Current Dose: 41.4 Gy  Planned Dose:  75 Gy  Narrative The patient presents for routine under treatment assessment.  The patient has completed 23 fractions to his prostate. He reports having some discomfort with urination that started a week ago. He continues to report having urinary urgency. He denies hematuria. The patient reports nocturia x 3-4. He also reports having diarrhea; he has not taken any Imodium this week. The patient reports fatigue. He denies any skin irritation.  Set-up films were reviewed. The chart was checked.  Physical Findings  height is 6\' 3"  (1.905 m) and weight is 192 lb (87.1 kg). His oral temperature is 98.2 F (36.8 C). His blood pressure is 157/96 (abnormal) and his pulse is 63. His respiration is 18 and oxygen saturation is 100%. . Weight essentially stable.  No significant changes.  Impression The patient is tolerating radiation.  Plan Continue treatment as planned.       Sheral Apley Tammi Klippel, M.D.  This document serves as a record of services personally performed by Tyler Pita, MD. It was created on his behalf by Maryla Morrow, a trained medical scribe. The creation of this record is based on the scribe's personal observations and the provider's statements to them. This document has been checked and approved by the attending provider.

## 2016-02-06 ENCOUNTER — Ambulatory Visit: Payer: Medicare Other

## 2016-02-06 ENCOUNTER — Ambulatory Visit
Admission: RE | Admit: 2016-02-06 | Discharge: 2016-02-06 | Disposition: A | Discharge status: Home / Self Care | Payer: Medicare Other | Source: Ambulatory Visit | Attending: Radiation Oncology | Admitting: Radiation Oncology

## 2016-02-06 DIAGNOSIS — Z510 Encounter for antineoplastic radiation therapy: Secondary | ICD-10-CM | POA: Diagnosis not present

## 2016-02-07 ENCOUNTER — Ambulatory Visit
Admission: RE | Admit: 2016-02-07 | Discharge: 2016-02-07 | Disposition: A | Discharge status: Home / Self Care | Payer: Medicare Other | Source: Ambulatory Visit | Attending: Radiation Oncology | Admitting: Radiation Oncology

## 2016-02-07 ENCOUNTER — Ambulatory Visit: Payer: Medicare Other

## 2016-02-07 DIAGNOSIS — Z510 Encounter for antineoplastic radiation therapy: Secondary | ICD-10-CM | POA: Diagnosis not present

## 2016-02-08 ENCOUNTER — Ambulatory Visit: Payer: Medicare Other

## 2016-02-08 ENCOUNTER — Ambulatory Visit
Admission: RE | Admit: 2016-02-08 | Discharge: 2016-02-08 | Disposition: A | Discharge status: Home / Self Care | Payer: Medicare Other | Source: Ambulatory Visit | Attending: Radiation Oncology | Admitting: Radiation Oncology

## 2016-02-08 DIAGNOSIS — Z510 Encounter for antineoplastic radiation therapy: Secondary | ICD-10-CM | POA: Diagnosis not present

## 2016-02-09 ENCOUNTER — Ambulatory Visit
Admission: RE | Admit: 2016-02-09 | Discharge: 2016-02-09 | Disposition: A | Discharge status: Home / Self Care | Payer: Medicare Other | Source: Ambulatory Visit | Attending: Radiation Oncology | Admitting: Radiation Oncology

## 2016-02-09 ENCOUNTER — Encounter: Payer: Self-pay | Admitting: Radiation Oncology

## 2016-02-09 ENCOUNTER — Ambulatory Visit: Payer: Medicare Other

## 2016-02-09 VITALS — BP 159/99 | HR 58 | Temp 98.2°F | Resp 18 | Ht 75.0 in | Wt 190.0 lb

## 2016-02-09 DIAGNOSIS — C61 Malignant neoplasm of prostate: Secondary | ICD-10-CM

## 2016-02-09 DIAGNOSIS — Z510 Encounter for antineoplastic radiation therapy: Secondary | ICD-10-CM | POA: Diagnosis not present

## 2016-02-09 NOTE — Progress Notes (Signed)
Domenick Chatmon has completed 27 fractions to his prostate. He reports having some discomfort with urination that started a week ago. He continues to report having urinary urgency and having penial pain at times. He denies having hematuria. He reports nocturia x 3-4. He reports having frequent small bowel movements, today he had a normal bowel movement. He has not taken any  Imodium this week. He reports having fatigue. He denies having any skin irritation. Wt Readings from Last 3 Encounters:  02/09/16 190 lb (86.2 kg)  02/03/16 192 lb (87.1 kg)  01/26/16 192 lb 6.4 oz (87.3 kg)  BP (!) 159/99   Pulse (!) 58   Temp 98.2 F (36.8 C) (Oral)   Resp 18   Ht 6\' 3"  (1.905 m)   Wt 190 lb (86.2 kg)   SpO2 99%   BMI 23.75 kg/m

## 2016-02-09 NOTE — Progress Notes (Signed)
  Radiation Oncology         [phone]   Name: Vincent Schneider MRN: [id number]   Date: 02/09/2016  DOB: Apr 22, 1940   Weekly Radiation Therapy Management    ICD-9-CM ICD-10-CM   1. Malignant neoplasm of prostate ([facility]) 185 C61     Current Dose: 49 Gy  Planned Dose:  75 Gy  Narrative The patient presents for routine under treatment assessment.  27 out of 40 fractions completed. The patient reports having some discomfort with urination that started a week ago. He continues to report urinary urgency and penial pain at times. He denies hematuria. He reports nocturia x 3-4. The patient reports having frequent small bowel movements, and today had a normal bowel movement. He reports he has not taken any Imodium this week. He reports fatigue. The patient denies any skin irritation.  Set-up films were reviewed. The chart was checked.  Physical Findings  height is 6\' 3"  (1.905 m) and weight is 190 lb (86.2 kg). His oral temperature is 98.2 F (36.8 C). His blood pressure is 159/99 (abnormal) and his pulse is 58 (abnormal). His respiration is 18 and oxygen saturation is 99%. . Weight essentially stable.  No significant changes.  Impression The patient is tolerating radiation.  Plan Continue treatment as planned.       Sheral Apley Tammi Klippel, M.D.  This document serves as a record of services personally performed by Tyler Pita, MD. It was created on his behalf by Maryla Morrow, a trained medical scribe. The creation of this record is based on the scribe's personal observations and the provider's statements to them. This document has been checked and approved by the attending provider.

## 2016-02-10 ENCOUNTER — Ambulatory Visit: Payer: Medicare Other

## 2016-02-10 ENCOUNTER — Ambulatory Visit
Admission: RE | Admit: 2016-02-10 | Discharge: 2016-02-10 | Disposition: A | Discharge status: Home / Self Care | Payer: Medicare Other | Source: Ambulatory Visit | Attending: Radiation Oncology | Admitting: Radiation Oncology

## 2016-02-10 DIAGNOSIS — Z510 Encounter for antineoplastic radiation therapy: Secondary | ICD-10-CM | POA: Diagnosis not present

## 2016-02-13 ENCOUNTER — Ambulatory Visit: Payer: Medicare Other

## 2016-02-13 ENCOUNTER — Ambulatory Visit
Admission: RE | Admit: 2016-02-13 | Discharge: 2016-02-13 | Disposition: A | Discharge status: Home / Self Care | Payer: Medicare Other | Source: Ambulatory Visit | Attending: Radiation Oncology | Admitting: Radiation Oncology

## 2016-02-13 DIAGNOSIS — Z510 Encounter for antineoplastic radiation therapy: Secondary | ICD-10-CM | POA: Diagnosis not present

## 2016-02-14 ENCOUNTER — Ambulatory Visit: Payer: Medicare Other

## 2016-02-14 ENCOUNTER — Ambulatory Visit
Admission: RE | Admit: 2016-02-14 | Discharge: 2016-02-14 | Disposition: A | Discharge status: Home / Self Care | Payer: Medicare Other | Source: Ambulatory Visit | Attending: Radiation Oncology | Admitting: Radiation Oncology

## 2016-02-14 ENCOUNTER — Encounter: Payer: Self-pay | Admitting: Medical Oncology

## 2016-02-14 DIAGNOSIS — Z510 Encounter for antineoplastic radiation therapy: Secondary | ICD-10-CM | POA: Diagnosis not present

## 2016-02-14 NOTE — Progress Notes (Signed)
Vincent Schneider is doing well with treatments. He states he has fatigue by the end of the week and nocturia. He is currently taking Flomax. We discussed that he is nearing treatment and these side effects become more pronounced.

## 2016-02-15 ENCOUNTER — Ambulatory Visit
Admission: RE | Admit: 2016-02-15 | Discharge: 2016-02-15 | Disposition: A | Discharge status: Home / Self Care | Payer: Medicare Other | Source: Ambulatory Visit | Attending: Radiation Oncology | Admitting: Radiation Oncology

## 2016-02-15 ENCOUNTER — Ambulatory Visit: Payer: Medicare Other

## 2016-02-15 DIAGNOSIS — Z510 Encounter for antineoplastic radiation therapy: Secondary | ICD-10-CM | POA: Diagnosis not present

## 2016-02-16 ENCOUNTER — Ambulatory Visit
Admission: RE | Admit: 2016-02-16 | Discharge: 2016-02-16 | Disposition: A | Discharge status: Home / Self Care | Payer: Medicare Other | Source: Ambulatory Visit | Attending: Radiation Oncology | Admitting: Radiation Oncology

## 2016-02-16 ENCOUNTER — Ambulatory Visit: Payer: Medicare Other

## 2016-02-16 DIAGNOSIS — Z510 Encounter for antineoplastic radiation therapy: Secondary | ICD-10-CM | POA: Diagnosis not present

## 2016-02-17 ENCOUNTER — Ambulatory Visit
Admission: RE | Admit: 2016-02-17 | Discharge: 2016-02-17 | Disposition: A | Discharge status: Home / Self Care | Payer: Medicare Other | Source: Ambulatory Visit | Attending: Radiation Oncology | Admitting: Radiation Oncology

## 2016-02-17 ENCOUNTER — Ambulatory Visit: Payer: Medicare Other

## 2016-02-17 VITALS — BP 123/91 | HR 70 | Resp 16 | Wt 188.4 lb

## 2016-02-17 DIAGNOSIS — Z510 Encounter for antineoplastic radiation therapy: Secondary | ICD-10-CM | POA: Diagnosis not present

## 2016-02-17 DIAGNOSIS — C61 Malignant neoplasm of prostate: Secondary | ICD-10-CM

## 2016-02-17 NOTE — Progress Notes (Signed)
  Radiation Oncology         [phone]   Name: Vincent Schneider MRN: [id number]   Date: 02/17/2016  DOB: 01/28/1941   Weekly Radiation Therapy Management    ICD-9-CM ICD-10-CM   1. Malignant neoplasm of prostate ([facility]) 185 C61     Current Dose: 61 Gy  Planned Dose:  75 Gy  Narrative The patient presents for routine under treatment assessment.  Reports penile pain following urination. He describes the pain as shooting. Denies flank pain during or after urination. Denies dysuria or hematuria. Reports nocturia x 4. Denies urinary frequency. Reports urinary urgency. Reports occasional leakage due to urgency. Describes urine stream is intermittent, but denies difficulty emptying his bladder. Denies diarrhea at this time, but reports diarrhea the first week of treatment. Describes bowel movements as frequent and small. Reports mild fatigue.  Set-up films were reviewed. The chart was checked.  Physical Findings  weight is 188 lb 6.4 oz (85.5 kg). His blood pressure is 123/91 (abnormal) and his pulse is 70. His respiration is 16 and oxygen saturation is 100%. . Weight essentially stable.  No significant changes.  Impression The patient is tolerating radiation.  Plan Continue treatment as planned.       Sheral Apley Tammi Klippel, M.D.  This document serves as a record of services personally performed by Tyler Pita, MD. It was created on his behalf by Darcus Austin, a trained medical scribe. The creation of this record is based on the scribe's personal observations and the provider's statements to them. This document has been checked and approved by the attending provider.

## 2016-02-17 NOTE — Progress Notes (Signed)
Weight and vitals stable. Reports penile pain following urination. Denies dysuria or hematuria. Reports nocturia x 4. Denies urinary frequency. Reports urinary urgency. Reports occasional leakage due to urgency. Describes urine stream is intermittent but, denies difficulty emptying his bladder.  Denies diarrhea. Describes bowel movements as frequent and small. Reports mild fatigue.   BP (!) 123/91   Pulse 70   Resp 16   Wt 188 lb 6.4 oz (85.5 kg)   SpO2 100%   BMI 23.55 kg/m  Wt Readings from Last 3 Encounters:  02/17/16 188 lb 6.4 oz (85.5 kg)  02/09/16 190 lb (86.2 kg)  02/03/16 192 lb (87.1 kg)

## 2016-02-20 ENCOUNTER — Ambulatory Visit
Admission: RE | Admit: 2016-02-20 | Discharge: 2016-02-20 | Disposition: A | Discharge status: Home / Self Care | Payer: Medicare Other | Source: Ambulatory Visit | Attending: Radiation Oncology | Admitting: Radiation Oncology

## 2016-02-20 ENCOUNTER — Ambulatory Visit: Payer: Medicare Other

## 2016-02-20 DIAGNOSIS — Z510 Encounter for antineoplastic radiation therapy: Secondary | ICD-10-CM | POA: Diagnosis not present

## 2016-02-21 ENCOUNTER — Ambulatory Visit
Admission: RE | Admit: 2016-02-21 | Discharge: 2016-02-21 | Disposition: A | Discharge status: Home / Self Care | Payer: Medicare Other | Source: Ambulatory Visit | Attending: Radiation Oncology | Admitting: Radiation Oncology

## 2016-02-21 ENCOUNTER — Ambulatory Visit: Payer: Medicare Other

## 2016-02-21 DIAGNOSIS — Z510 Encounter for antineoplastic radiation therapy: Secondary | ICD-10-CM | POA: Diagnosis not present

## 2016-02-22 ENCOUNTER — Ambulatory Visit
Admission: RE | Admit: 2016-02-22 | Discharge: 2016-02-22 | Disposition: A | Discharge status: Home / Self Care | Payer: Medicare Other | Source: Ambulatory Visit | Attending: Radiation Oncology | Admitting: Radiation Oncology

## 2016-02-22 ENCOUNTER — Ambulatory Visit: Payer: Medicare Other

## 2016-02-22 DIAGNOSIS — Z510 Encounter for antineoplastic radiation therapy: Secondary | ICD-10-CM | POA: Diagnosis not present

## 2016-02-23 ENCOUNTER — Ambulatory Visit: Payer: Medicare Other

## 2016-02-23 ENCOUNTER — Ambulatory Visit
Admission: RE | Admit: 2016-02-23 | Discharge: 2016-02-23 | Disposition: A | Discharge status: Home / Self Care | Payer: Medicare Other | Source: Ambulatory Visit | Attending: Radiation Oncology | Admitting: Radiation Oncology

## 2016-02-23 ENCOUNTER — Encounter: Payer: Self-pay | Admitting: Radiation Oncology

## 2016-02-23 VITALS — BP 135/87 | HR 66 | Temp 98.3°F | Ht 75.0 in | Wt 188.6 lb

## 2016-02-23 DIAGNOSIS — C61 Malignant neoplasm of prostate: Secondary | ICD-10-CM

## 2016-02-23 DIAGNOSIS — Z510 Encounter for antineoplastic radiation therapy: Secondary | ICD-10-CM | POA: Diagnosis not present

## 2016-02-23 NOTE — Progress Notes (Signed)
  Radiation Oncology         [phone]   Name: Vincent Schneider MRN: [id number]   Date: 02/23/2016  DOB: Oct 20, 1940   Weekly Radiation Therapy Management    ICD-9-CM ICD-10-CM   1. Malignant neoplasm of prostate ([facility]) 185 C61     Current Dose: 69 Gy  Planned Dose:  75 Gy  Narrative The patient presents for routine under treatment assessment.  He reports the prostate pain with urination that he had last week has gone away, nocturia x4, urinary urgency, and fatigue. He reports getting up every hour last night after drinking a beer. He denies hematuria, diarrhea, or skin irritation in the treatment field.  Set-up films were reviewed. The chart was checked.  Physical Findings  height is 6\' 3"  (1.905 m) and weight is 188 lb 9.6 oz (85.5 kg). His oral temperature is 98.3 F (36.8 C). His blood pressure is 135/87 and his pulse is 66. His oxygen saturation is 100%. . Weight essentially stable.  No significant changes.  Impression The patient is tolerating radiation.  Plan Continue treatment as planned. The patient was given a 1 month follow up appointment.     Sheral Apley Tammi Klippel, M.D.  This document serves as a record of services personally performed by Tyler Pita, MD. It was created on his behalf by Darcus Austin, a trained medical scribe. The creation of this record is based on the scribe's personal observations and the provider's statements to them. This document has been checked and approved by the attending provider.

## 2016-02-23 NOTE — Progress Notes (Signed)
Vincent Schneider has completed 37 fractions to his prostate.  He reports the prostate pain with urination that he had last week has gone away.  He reports having nocturia usually 4 times.  He reports getting up every hour last night after drinking a beer.  He denies having hematuria.  He does report having urinary urgency.  He denies having diarrhea and skin irritation.  He reports feeling fatigued.  He has been given a one month follow up appointment.  BP 135/87 (BP Location: Right Arm, Patient Position: Sitting)   Pulse 66   Temp 98.3 F (36.8 C) (Oral)   Ht 6\' 3"  (1.905 m)   Wt 188 lb 9.6 oz (85.5 kg)   SpO2 100%   BMI 23.57 kg/m    Wt Readings from Last 3 Encounters:  02/23/16 188 lb 9.6 oz (85.5 kg)  02/17/16 188 lb 6.4 oz (85.5 kg)  02/09/16 190 lb (86.2 kg)

## 2016-02-24 ENCOUNTER — Ambulatory Visit
Admission: RE | Admit: 2016-02-24 | Discharge: 2016-02-24 | Disposition: A | Discharge status: Home / Self Care | Payer: Medicare Other | Source: Ambulatory Visit | Attending: Radiation Oncology | Admitting: Radiation Oncology

## 2016-02-24 ENCOUNTER — Ambulatory Visit: Payer: Medicare Other

## 2016-02-24 DIAGNOSIS — Z510 Encounter for antineoplastic radiation therapy: Secondary | ICD-10-CM | POA: Diagnosis not present

## 2016-02-27 ENCOUNTER — Ambulatory Visit: Payer: Medicare Other

## 2016-02-27 ENCOUNTER — Ambulatory Visit
Admission: RE | Admit: 2016-02-27 | Discharge: 2016-02-27 | Disposition: A | Discharge status: Home / Self Care | Payer: Medicare Other | Source: Ambulatory Visit | Attending: Radiation Oncology | Admitting: Radiation Oncology

## 2016-02-27 DIAGNOSIS — Z510 Encounter for antineoplastic radiation therapy: Secondary | ICD-10-CM | POA: Diagnosis not present

## 2016-02-28 ENCOUNTER — Ambulatory Visit: Payer: Medicare Other

## 2016-02-28 ENCOUNTER — Encounter: Payer: Self-pay | Admitting: Radiation Oncology

## 2016-02-28 ENCOUNTER — Ambulatory Visit
Admission: RE | Admit: 2016-02-28 | Discharge: 2016-02-28 | Disposition: A | Discharge status: Home / Self Care | Payer: Medicare Other | Source: Ambulatory Visit | Attending: Radiation Oncology | Admitting: Radiation Oncology

## 2016-02-28 DIAGNOSIS — Z510 Encounter for antineoplastic radiation therapy: Secondary | ICD-10-CM | POA: Diagnosis not present

## 2016-02-29 ENCOUNTER — Ambulatory Visit: Payer: Medicare Other

## 2016-02-29 NOTE — Progress Notes (Signed)
  Radiation Oncology         [phone] ________________________________  Name: Vincent Schneider MRN: [id number]  Date: 02/28/2016  DOB: [birth year]  End of Treatment Note  Diagnosis:   75 y.o. gentleman with stage T1c adenocarcinoma of the prostate with a Gleason's score of 4+3 and a PSA of 43.66.     Indication for treatment:  Curative       Radiation treatment dates:   01/03/2016 to 02/28/2016  Site/dose:    1. The prostate pelvic nodes were treated to 45 Gy in 25 fractions at 1.8 Gy per fraction. 2. The prostate was boosted to 30 Gy in 15 fractions at 2 Gy per fraction.   Beams/energy:    1. IMRT // 6X 2. IMRT // 6X  Narrative: The patient tolerated radiation treatment relatively well.   He experienced mild fatigue and moderate urinary symptoms including, urgency and nocturia x 4.  Plan: The patient has completed radiation treatment. The patient will return to radiation oncology clinic for routine followup in one month. I advised him to call or return sooner if he has any questions or concerns related to his recovery or treatment. ________________________________  Sheral Apley. Tammi Klippel, M.D.   This document serves as a record of services personally performed by Tyler Pita, MD. It was created on his behalf by Arlyce Harman, a trained medical scribe. The creation of this record is based on the scribe's personal observations and the provider's statements to them. This document has been checked and approved by the attending provider.

## 2016-04-05 NOTE — Progress Notes (Signed)
[name]. Mirelez a 75 year old man stage T1c adenocarcinoma of the prostate with a Gleason's score of 4+3 and a PSA of 43.66.  PAIN: He is currently  Is not having pain. URINARY: He reports  urinary frequency,urinary urgency and dribbling wears a pad. Pt states he gets up to urinate 3-4 times per night.  Denies hematuria, urinary retention and feels he is emptying his  bladder. BOWEL: He reports a  bowel movement everyday soft stools going in the middle of the night and late night with the urge to go, just has to have a bowel movement. Fatigue:No Appetite: Good Weight: Wt Readings from Last 3 Encounters:  04/10/16 194 lb 6.4 oz (88.2 kg)  02/23/16 188 lb 9.6 oz (85.5 kg)  02/17/16 188 lb 6.4 oz (85.5 kg)   Lupron injection:07/28-17 last injection hopes he will not have to take another injection he does not like the hot flashes Urologist: Dr. Ashley Murrain last in July will see 2/12-18 for a follow up appointment. BP (!) 139/93   Pulse 78   Temp 98 F (36.7 C) (Oral)   Resp 18   Ht 6\' 3"  (1.905 m)   Wt 194 lb 6.4 oz (88.2 kg)   SpO2 100%   BMI 24.30 kg/m

## 2016-04-10 ENCOUNTER — Ambulatory Visit
Admission: RE | Admit: 2016-04-10 | Discharge: 2016-04-10 | Disposition: A | Discharge status: Home / Self Care | Payer: Medicare Other | Source: Ambulatory Visit | Attending: Radiation Oncology | Admitting: Radiation Oncology

## 2016-04-10 ENCOUNTER — Encounter: Payer: Self-pay | Admitting: Radiation Oncology

## 2016-04-10 VITALS — BP 139/93 | HR 78 | Temp 98.0°F | Resp 18 | Ht 75.0 in | Wt 194.4 lb

## 2016-04-10 DIAGNOSIS — C61 Malignant neoplasm of prostate: Secondary | ICD-10-CM | POA: Diagnosis present

## 2016-04-12 NOTE — Progress Notes (Signed)
Radiation Oncology         [phone] ________________________________  Name: KEJUAN GEISLER MRN: [id number]  Date: 04/10/2016  DOB: [birth year]  Post Treatment Note  CC: Pcp Not In System  No ref. provider found  Diagnosis:   76 y.o. gentleman with stage T1c adenocarcinoma of the prostate with a Gleason's score of 4+3 and a PSA of 43.66.  Interval Since Last Radiation:  6 weeks   01/03/2016 to 02/28/2016: 1. The prostate pelvic nodes were treated to 45 Gy in 25 fractions at 1.8 Gy per fraction. 2. The prostate was boosted to 30 Gy in 15 fractions at 2 Gy per fraction.    Narrative:  The patient returns today for routine follow-up.  During the course of treatment, he did have urinary frequency, nocturia, and fatigue along with loose stools.                              On review of systems, the patient states his symptoms have improved since completing radiotherapy. He continues to have hot flashes as a result of his Lupron, and is due for another injection in February with Dr. Junious Silk. He continues to take MiraLAX and Metamucil for bowel movements, but is concerned that he is having smear-like stains were after passing gas but is not needing to have a bowel movement. He denies any progressive urinary symptoms and states that his frequency and nocturia is about at baseline.  ALLERGIES:  has No Known Allergies.  Meds: Current Outpatient Prescriptions  Medication Sig Dispense Refill  . aspirin EC 81 MG tablet Take by mouth.    Marland Kitchen b complex vitamins capsule Take by mouth.    . B Complex-C-Iron (SUPER B-COMPLEX/IRON/VITAMIN C PO) Take by mouth.    . calcium-vitamin D 250-100 MG-UNIT tablet Take 1 tablet by mouth 2 (two) times daily. 630 mg Ca+500 IU D3    . latanoprost (XALATAN) 0.005 % ophthalmic solution Apply to eye.    . Melatonin 5 MG CAPS Take by mouth.    . Multiple Vitamins-Minerals (MEGA MULTIVITAMIN FOR MEN PO) Take by mouth.    . Omega-3 Fatty Acids (FISH OIL CONCENTRATE)  300 MG CAPS Take by mouth.    . simvastatin (ZOCOR) 10 MG tablet     . tamsulosin (FLOMAX) 0.4 MG CAPS capsule Take by mouth.    . traZODone (DESYREL) 150 MG tablet      No current facility-administered medications for this encounter.     Physical Findings:  height is 6\' 3"  (1.905 m) and weight is 194 lb 6.4 oz (88.2 kg). His oral temperature is 98 F (36.7 C). His blood pressure is 139/93 (abnormal) and his pulse is 78. His respiration is 18 and oxygen saturation is 100%.  In general this is a well appearing Caucasian male in no acute distress. He's alert and oriented x4 and appropriate throughout the examination. Cardiopulmonary assessment is negative for acute distress and he exhibits normal effort.   Lab Findings: Lab Results  Component Value Date   WBC 10.8 (H) 09/26/2014   HGB 12.7 (L) 09/26/2014   HCT 37.8 (L) 09/26/2014   MCV 93.8 09/26/2014   PLT 187 09/26/2014     Radiographic Findings: No results found.  Impression/Plan:  1. 76 y.o. gentleman with stage T1c adenocarcinoma of the prostate with a Gleason's score of 4+3 and a PSA of 43.66. The patient appears to be doing well since completing radiotherapy, is  treatment related symptoms continue to improve. He is due for his next Lupron injection with Dr. Junious Silk in February and has an appointment scheduled. We will be happy to see him back as needed moving forward, however he will continue his surveillance at [facility]. 2. Loose stools. The patient was advised to discontinue the use of MiraLAX, and to see if this makes improvement as he is currently using Metamucil as a stool bulking agent. He will also discontinue Metamucil if he feels that this has not improved.    Carola Rhine, PAC

## 2016-07-03 ENCOUNTER — Other Ambulatory Visit: Payer: Self-pay | Admitting: Otolaryngology

## 2016-07-03 DIAGNOSIS — H9121 Sudden idiopathic hearing loss, right ear: Secondary | ICD-10-CM

## 2016-07-09 ENCOUNTER — Ambulatory Visit
Admission: RE | Admit: 2016-07-09 | Discharge: 2016-07-09 | Disposition: A | Discharge status: Home / Self Care | Payer: Medicare Other | Source: Ambulatory Visit | Attending: Otolaryngology | Admitting: Otolaryngology

## 2016-07-09 DIAGNOSIS — I781 Nevus, non-neoplastic: Secondary | ICD-10-CM | POA: Insufficient documentation

## 2016-07-09 DIAGNOSIS — H9121 Sudden idiopathic hearing loss, right ear: Secondary | ICD-10-CM | POA: Insufficient documentation

## 2016-07-09 LAB — POCT I-STAT CREATININE: CREATININE: 0.9 mg/dL (ref 0.61–1.24)

## 2016-07-09 MED ORDER — GADOBENATE DIMEGLUMINE 529 MG/ML IV SOLN
20.0000 mL | Freq: Once | INTRAVENOUS | Status: AC | PRN
  Administered 2016-07-09: 18 mL via INTRAVENOUS

## 2018-09-04 ENCOUNTER — Other Ambulatory Visit: Payer: Self-pay

## 2018-09-04 NOTE — Discharge Instructions (Signed)

## 2018-09-05 ENCOUNTER — Other Ambulatory Visit
Admission: RE | Admit: 2018-09-05 | Discharge: 2018-09-05 | Disposition: A | Discharge status: Home / Self Care | Payer: Medicare Other | Source: Ambulatory Visit | Attending: Ophthalmology | Admitting: Ophthalmology

## 2018-09-05 DIAGNOSIS — Z1159 Encounter for screening for other viral diseases: Secondary | ICD-10-CM | POA: Diagnosis present

## 2018-09-06 LAB — NOVEL CORONAVIRUS, NAA (HOSP ORDER, SEND-OUT TO REF LAB; TAT 18-24 HRS): SARS-CoV-2, NAA: NOT DETECTED

## 2018-09-10 ENCOUNTER — Ambulatory Visit: Payer: Medicare Other | Admitting: Anesthesiology

## 2018-09-10 ENCOUNTER — Ambulatory Visit
Admission: RE | Admit: 2018-09-10 | Discharge: 2018-09-10 | Disposition: A | Discharge status: Home / Self Care | Payer: Medicare Other | Attending: Ophthalmology | Admitting: Ophthalmology

## 2018-09-10 ENCOUNTER — Encounter
Admission: RE | Disposition: A | Discharge status: Home / Self Care | Payer: Self-pay | Source: Home / Self Care | Attending: Ophthalmology

## 2018-09-10 DIAGNOSIS — Z8546 Personal history of malignant neoplasm of prostate: Secondary | ICD-10-CM | POA: Diagnosis not present

## 2018-09-10 DIAGNOSIS — M1990 Unspecified osteoarthritis, unspecified site: Secondary | ICD-10-CM | POA: Diagnosis not present

## 2018-09-10 DIAGNOSIS — E7800 Pure hypercholesterolemia, unspecified: Secondary | ICD-10-CM | POA: Diagnosis not present

## 2018-09-10 DIAGNOSIS — G4730 Sleep apnea, unspecified: Secondary | ICD-10-CM | POA: Insufficient documentation

## 2018-09-10 DIAGNOSIS — K5790 Diverticulosis of intestine, part unspecified, without perforation or abscess without bleeding: Secondary | ICD-10-CM | POA: Insufficient documentation

## 2018-09-10 DIAGNOSIS — H2512 Age-related nuclear cataract, left eye: Secondary | ICD-10-CM | POA: Diagnosis not present

## 2018-09-10 DIAGNOSIS — F419 Anxiety disorder, unspecified: Secondary | ICD-10-CM | POA: Diagnosis not present

## 2018-09-10 DIAGNOSIS — Z79899 Other long term (current) drug therapy: Secondary | ICD-10-CM | POA: Insufficient documentation

## 2018-09-10 DIAGNOSIS — H9192 Unspecified hearing loss, left ear: Secondary | ICD-10-CM | POA: Insufficient documentation

## 2018-09-10 DIAGNOSIS — Z7982 Long term (current) use of aspirin: Secondary | ICD-10-CM | POA: Insufficient documentation

## 2018-09-10 HISTORY — PX: CATARACT EXTRACTION W/PHACO: SHX586

## 2018-09-10 HISTORY — DX: Family history of other specified conditions: Z84.89

## 2018-09-10 HISTORY — DX: Other complications of anesthesia, initial encounter: T88.59XA

## 2018-09-10 SURGERY — PHACOEMULSIFICATION, CATARACT, WITH IOL INSERTION
Anesthesia: Monitor Anesthesia Care | Site: Eye | Laterality: Left

## 2018-09-10 MED ORDER — MOXIFLOXACIN HCL 0.5 % OP SOLN
1.0000 [drp] | OPHTHALMIC | Status: DC | PRN
  Administered 2018-09-10 (×3): 1 [drp] via OPHTHALMIC

## 2018-09-10 MED ORDER — BRIMONIDINE TARTRATE-TIMOLOL 0.2-0.5 % OP SOLN
OPHTHALMIC | Status: DC | PRN
  Administered 2018-09-10: 1 [drp] via OPHTHALMIC

## 2018-09-10 MED ORDER — LIDOCAINE HCL (PF) 2 % IJ SOLN
INTRAOCULAR | Status: DC | PRN
  Administered 2018-09-10: 1 mL

## 2018-09-10 MED ORDER — FENTANYL CITRATE (PF) 100 MCG/2ML IJ SOLN
INTRAMUSCULAR | Status: DC | PRN
  Administered 2018-09-10: 50 ug via INTRAVENOUS

## 2018-09-10 MED ORDER — TETRACAINE HCL 0.5 % OP SOLN
1.0000 [drp] | OPHTHALMIC | Status: DC | PRN
  Administered 2018-09-10 (×3): 1 [drp] via OPHTHALMIC

## 2018-09-10 MED ORDER — NA HYALUR & NA CHOND-NA HYALUR 0.4-0.35 ML IO KIT
PACK | INTRAOCULAR | Status: DC | PRN
  Administered 2018-09-10: 1 mL via INTRAOCULAR

## 2018-09-10 MED ORDER — LACTATED RINGERS IV SOLN: 10.0000 mL/h | INTRAVENOUS | Status: DC

## 2018-09-10 MED ORDER — CEFUROXIME OPHTHALMIC INJECTION 1 MG/0.1 ML
INJECTION | OPHTHALMIC | Status: DC | PRN
  Administered 2018-09-10: 0.1 mL via OPHTHALMIC

## 2018-09-10 MED ORDER — BSS IO SOLN
INTRAOCULAR | Status: DC | PRN
  Administered 2018-09-10: 92 mL

## 2018-09-10 MED ORDER — MIDAZOLAM HCL 2 MG/2ML IJ SOLN
INTRAMUSCULAR | Status: DC | PRN
  Administered 2018-09-10: 2 mg via INTRAVENOUS

## 2018-09-10 MED ORDER — ARMC OPHTHALMIC DILATING DROPS
1.0000 "application " | OPHTHALMIC | Status: DC | PRN
  Administered 2018-09-10 (×3): 1 via OPHTHALMIC

## 2018-09-10 SURGICAL SUPPLY — 26 items
CANNULA ANT/CHMB 27G (MISCELLANEOUS) ×1 IMPLANT
CANNULA ANT/CHMB 27GA (MISCELLANEOUS) ×2 IMPLANT
GLOVE SURG LX 7.5 STRW (GLOVE) ×2
GLOVE SURG LX STRL 7.5 STRW (GLOVE) ×1 IMPLANT
GLOVE SURG TRIUMPH 8.0 PF LTX (GLOVE) ×2 IMPLANT
GOWN STRL REUS W/ TWL LRG LVL3 (GOWN DISPOSABLE) ×2 IMPLANT
GOWN STRL REUS W/TWL LRG LVL3 (GOWN DISPOSABLE) ×2
LENS IOL ACRYSOF IQ 19.5 (Intraocular Lens) ×1 IMPLANT
MARKER SKIN DUAL TIP RULER LAB (MISCELLANEOUS) ×2 IMPLANT
NDL FILTER BLUNT 18X1 1/2 (NEEDLE) ×1 IMPLANT
NDL RETROBULBAR .5 NSTRL (NEEDLE) IMPLANT
NEEDLE FILTER BLUNT 18X 1/2SAF (NEEDLE) ×1
NEEDLE FILTER BLUNT 18X1 1/2 (NEEDLE) ×1 IMPLANT
PACK CATARACT BRASINGTON (MISCELLANEOUS) ×2 IMPLANT
PACK EYE AFTER SURG (MISCELLANEOUS) ×2 IMPLANT
PACK OPTHALMIC (MISCELLANEOUS) ×2 IMPLANT
RING MALYGIN 7.0 (MISCELLANEOUS) IMPLANT
SUT ETHILON 10-0 CS-B-6CS-B-6 (SUTURE)
SUT VICRYL  9 0 (SUTURE)
SUT VICRYL 9 0 (SUTURE) IMPLANT
SUTURE EHLN 10-0 CS-B-6CS-B-6 (SUTURE) IMPLANT
SYR 3ML LL SCALE MARK (SYRINGE) ×2 IMPLANT
SYR 5ML LL (SYRINGE) ×2 IMPLANT
SYR TB 1ML LUER SLIP (SYRINGE) ×2 IMPLANT
WATER STERILE IRR 500ML POUR (IV SOLUTION) ×2 IMPLANT
WIPE NON LINTING 3.25X3.25 (MISCELLANEOUS) ×2 IMPLANT

## 2018-09-10 NOTE — H&P (Signed)

## 2018-09-10 NOTE — Transfer of Care (Signed)
Immediate Anesthesia Transfer of Care Note  Patient: Vincent Schneider  Procedure(s) Performed: CATARACT EXTRACTION PHACO AND INTRAOCULAR LENS PLACEMENT (IOC) COMPLICATED LEFT (Left Eye)  Patient Location: PACU  Anesthesia Type: MAC  Level of Consciousness: awake, alert  and patient cooperative  Airway and Oxygen Therapy: Patient Spontanous Breathing and Patient connected to supplemental oxygen  Post-op Assessment: Post-op Vital signs reviewed, Patient's Cardiovascular Status Stable, Respiratory Function Stable, Patent Airway and No signs of Nausea or vomiting  Post-op Vital Signs: Reviewed and stable  Complications: No apparent anesthesia complications

## 2018-09-10 NOTE — Anesthesia Preprocedure Evaluation (Signed)
Anesthesia Evaluation  Patient identified by MRN, date of birth, ID band Patient awake    Reviewed: Allergy & Precautions, H&P , NPO status , Patient's Chart, lab work & pertinent test results, reviewed documented beta blocker date and time   History of Anesthesia Complications (+) history of anesthetic complications (Patient describes having a vasovagal response after anal fistulotomy. States he stood up and passed out in the recovery room)  Airway Mallampati: II  TM Distance: >3 FB Neck ROM: full    Dental no notable dental hx.    Pulmonary neg pulmonary ROS,    Pulmonary exam normal breath sounds clear to auscultation       Cardiovascular Exercise Tolerance: Good negative cardio ROS   Rhythm:regular Rate:Normal     Neuro/Psych Anxiety negative neurological ROS  negative psych ROS   GI/Hepatic negative GI ROS, Neg liver ROS,   Endo/Other  negative endocrine ROSProstate CA  Renal/GU negative Renal ROS  negative genitourinary   Musculoskeletal   Abdominal   Peds  Hematology negative hematology ROS (+)   Anesthesia Other Findings   Reproductive/Obstetrics negative OB ROS                             Anesthesia Physical Anesthesia Plan  ASA: II  Anesthesia Plan: MAC   Post-op Pain Management:    Induction:   PONV Risk Score and Plan:   Airway Management Planned:   Additional Equipment:   Intra-op Plan:   Post-operative Plan:   Informed Consent: I have reviewed the patients History and Physical, chart, labs and discussed the procedure including the risks, benefits and alternatives for the proposed anesthesia with the patient or authorized representative who has indicated his/her understanding and acceptance.     Dental Advisory Given  Plan Discussed with: CRNA  Anesthesia Plan Comments:         Anesthesia Quick Evaluation

## 2018-09-10 NOTE — Op Note (Signed)
OPERATIVE NOTE  AZARIUS LAMBSON [id number] 09/10/2018   PREOPERATIVE DIAGNOSIS:  Nuclear sclerotic cataract left eye. H25.12   POSTOPERATIVE DIAGNOSIS:    Nuclear sclerotic cataract left eye.     PROCEDURE:  Phacoemusification with posterior chamber intraocular lens placement of the left eye   LENS:   Implant Name Type Inv. Item Serial No. Manufacturer Lot No. LRB No. Used  LENS IOL ACRYSOF IQ 19.5 - [id number] Intraocular Lens LENS IOL ACRYSOF IQ 19.5 [id number] ALCON  Left 1        ULTRASOUND TIME: 9  % of 1 minutes 10 seconds, CDE 6.8  SURGEON:  Wyonia Hough, MD   ANESTHESIA:  Topical with tetracaine drops and 2% Xylocaine jelly, augmented with 1% preservative-free intracameral lidocaine.    COMPLICATIONS:  None.   DESCRIPTION OF PROCEDURE:  The patient was identified in the holding room and transported to the operating room and placed in the supine position under the operating microscope.  The left eye was identified as the operative eye and it was prepped and draped in the usual sterile ophthalmic fashion.   A 1 millimeter clear-corneal paracentesis was made at the 1:30 position.  0.5 ml of preservative-free 1% lidocaine was injected into the anterior chamber.  The anterior chamber was filled with Viscoat viscoelastic.  A 2.4 millimeter keratome was used to make a near-clear corneal incision at the 10:30 position.  .  A curvilinear capsulorrhexis was made with a cystotome and capsulorrhexis forceps.  Balanced salt solution was used to hydrodissect and hydrodelineate the nucleus.   Phacoemulsification was then used in stop and chop fashion to remove the lens nucleus and epinucleus.  The remaining cortex was then removed using the irrigation and aspiration handpiece. Provisc was then placed into the capsular bag to distend it for lens placement.  A lens was then injected into the capsular bag.  The remaining viscoelastic was aspirated.   Wounds were hydrated with balanced  salt solution.  The anterior chamber was inflated to a physiologic pressure with balanced salt solution.  No wound leaks were noted. Cefuroxime 0.1 ml of a 10mg /ml solution was injected into the anterior chamber for a dose of 1 mg of intracameral antibiotic at the completion of the case.   Timolol and Brimonidine drops were applied to the eye.  The patient was taken to the recovery room in stable condition without complications of anesthesia or surgery.  Matayah Reyburn 09/10/2018, 9:16 AM

## 2018-09-10 NOTE — Anesthesia Postprocedure Evaluation (Signed)
Anesthesia Post Note  Patient: Vincent Schneider  Procedure(s) Performed: CATARACT EXTRACTION PHACO AND INTRAOCULAR LENS PLACEMENT (IOC) COMPLICATED LEFT (Left Eye)  Patient location during evaluation: PACU Anesthesia Type: MAC Level of consciousness: awake and alert Pain management: pain level controlled Vital Signs Assessment: post-procedure vital signs reviewed and stable Respiratory status: spontaneous breathing, nonlabored ventilation, respiratory function stable and patient connected to nasal cannula oxygen Cardiovascular status: blood pressure returned to baseline and stable Postop Assessment: no apparent nausea or vomiting Anesthetic complications: no    Lucella Pommier ELAINE

## 2018-09-10 NOTE — Anesthesia Procedure Notes (Signed)
Procedure Name: MAC Date/Time: 09/10/2018 8:58 AM Performed by: Janna Arch, CRNA Pre-anesthesia Checklist: Patient identified, Emergency Drugs available, Suction available, Timeout performed and Patient being monitored Patient Re-evaluated:Patient Re-evaluated prior to induction Oxygen Delivery Method: Nasal cannula Placement Confirmation: positive ETCO2

## 2018-09-11 ENCOUNTER — Encounter: Payer: Self-pay | Admitting: Ophthalmology

## 2018-09-23 ENCOUNTER — Encounter: Payer: Self-pay | Admitting: *Deleted

## 2018-09-23 ENCOUNTER — Other Ambulatory Visit: Payer: Self-pay

## 2018-09-25 NOTE — Discharge Instructions (Signed)

## 2018-09-26 ENCOUNTER — Other Ambulatory Visit: Payer: Self-pay

## 2018-09-26 ENCOUNTER — Other Ambulatory Visit
Admission: RE | Admit: 2018-09-26 | Discharge: 2018-09-26 | Disposition: A | Discharge status: Home / Self Care | Payer: Medicare Other | Source: Ambulatory Visit | Attending: Ophthalmology | Admitting: Ophthalmology

## 2018-09-26 DIAGNOSIS — Z1159 Encounter for screening for other viral diseases: Secondary | ICD-10-CM | POA: Diagnosis present

## 2018-09-27 LAB — NOVEL CORONAVIRUS, NAA (HOSP ORDER, SEND-OUT TO REF LAB; TAT 18-24 HRS): SARS-CoV-2, NAA: NOT DETECTED

## 2018-10-01 ENCOUNTER — Encounter
Admission: RE | Disposition: A | Discharge status: Home / Self Care | Payer: Self-pay | Source: Home / Self Care | Attending: Ophthalmology

## 2018-10-01 ENCOUNTER — Other Ambulatory Visit: Payer: Self-pay

## 2018-10-01 ENCOUNTER — Ambulatory Visit
Admission: RE | Admit: 2018-10-01 | Discharge: 2018-10-01 | Disposition: A | Discharge status: Home / Self Care | Payer: Medicare Other | Attending: Ophthalmology | Admitting: Ophthalmology

## 2018-10-01 ENCOUNTER — Ambulatory Visit: Payer: Medicare Other | Admitting: Anesthesiology

## 2018-10-01 DIAGNOSIS — H2511 Age-related nuclear cataract, right eye: Secondary | ICD-10-CM | POA: Insufficient documentation

## 2018-10-01 DIAGNOSIS — F419 Anxiety disorder, unspecified: Secondary | ICD-10-CM | POA: Insufficient documentation

## 2018-10-01 DIAGNOSIS — M1990 Unspecified osteoarthritis, unspecified site: Secondary | ICD-10-CM | POA: Diagnosis not present

## 2018-10-01 DIAGNOSIS — Z9849 Cataract extraction status, unspecified eye: Secondary | ICD-10-CM | POA: Insufficient documentation

## 2018-10-01 DIAGNOSIS — Z8546 Personal history of malignant neoplasm of prostate: Secondary | ICD-10-CM | POA: Diagnosis not present

## 2018-10-01 DIAGNOSIS — E7800 Pure hypercholesterolemia, unspecified: Secondary | ICD-10-CM | POA: Diagnosis not present

## 2018-10-01 DIAGNOSIS — G4730 Sleep apnea, unspecified: Secondary | ICD-10-CM | POA: Insufficient documentation

## 2018-10-01 HISTORY — PX: CATARACT EXTRACTION W/PHACO: SHX586

## 2018-10-01 SURGERY — PHACOEMULSIFICATION, CATARACT, WITH IOL INSERTION
Anesthesia: Monitor Anesthesia Care | Site: Eye | Laterality: Right

## 2018-10-01 MED ORDER — MOXIFLOXACIN HCL 0.5 % OP SOLN
1.0000 [drp] | OPHTHALMIC | Status: DC | PRN
  Administered 2018-10-01 (×3): 1 [drp] via OPHTHALMIC

## 2018-10-01 MED ORDER — MIDAZOLAM HCL 2 MG/2ML IJ SOLN
INTRAMUSCULAR | Status: DC | PRN
  Administered 2018-10-01: 2 mg via INTRAVENOUS

## 2018-10-01 MED ORDER — BRIMONIDINE TARTRATE-TIMOLOL 0.2-0.5 % OP SOLN
OPHTHALMIC | Status: DC | PRN
  Administered 2018-10-01: 1 [drp] via OPHTHALMIC

## 2018-10-01 MED ORDER — NA HYALUR & NA CHOND-NA HYALUR 0.4-0.35 ML IO KIT
PACK | INTRAOCULAR | Status: DC | PRN
  Administered 2018-10-01: 1 mL via INTRAOCULAR

## 2018-10-01 MED ORDER — LIDOCAINE HCL (PF) 2 % IJ SOLN
INTRAOCULAR | Status: DC | PRN
  Administered 2018-10-01: 2 mL

## 2018-10-01 MED ORDER — PHENYLEPHRINE-KETOROLAC 1-0.3 % IO SOLN
INTRAOCULAR | Status: DC | PRN
  Administered 2018-10-01: 78 mL via OPHTHALMIC

## 2018-10-01 MED ORDER — FENTANYL CITRATE (PF) 100 MCG/2ML IJ SOLN
INTRAMUSCULAR | Status: DC | PRN
  Administered 2018-10-01: 50 ug via INTRAVENOUS

## 2018-10-01 MED ORDER — CEFUROXIME OPHTHALMIC INJECTION 1 MG/0.1 ML
INJECTION | OPHTHALMIC | Status: DC | PRN
  Administered 2018-10-01: 0.1 mL via INTRACAMERAL

## 2018-10-01 MED ORDER — ARMC OPHTHALMIC DILATING DROPS
1.0000 "application " | OPHTHALMIC | Status: DC | PRN
  Administered 2018-10-01 (×3): 1 via OPHTHALMIC

## 2018-10-01 MED ORDER — EPINEPHRINE PF 1 MG/ML IJ SOLN
INTRAOCULAR | Status: DC | PRN
  Administered 2018-10-01: 78 mL via OPHTHALMIC

## 2018-10-01 MED ORDER — LACTATED RINGERS IV SOLN: INTRAVENOUS | Status: DC

## 2018-10-01 MED ORDER — TETRACAINE HCL 0.5 % OP SOLN
1.0000 [drp] | OPHTHALMIC | Status: DC | PRN
  Administered 2018-10-01 (×3): 1 [drp] via OPHTHALMIC

## 2018-10-01 SURGICAL SUPPLY — 20 items
CANNULA ANT/CHMB 27G (MISCELLANEOUS) ×1 IMPLANT
CANNULA ANT/CHMB 27GA (MISCELLANEOUS) ×3 IMPLANT
GLOVE SURG LX 7.5 STRW (GLOVE) ×4
GLOVE SURG LX STRL 7.5 STRW (GLOVE) ×1 IMPLANT
GLOVE SURG TRIUMPH 8.0 PF LTX (GLOVE) ×3 IMPLANT
GOWN STRL REUS W/ TWL LRG LVL3 (GOWN DISPOSABLE) ×2 IMPLANT
GOWN STRL REUS W/TWL LRG LVL3 (GOWN DISPOSABLE) ×4
LENS IOL ACRYSOF IQ 20.0 (Intraocular Lens) ×2 IMPLANT
MARKER SKIN DUAL TIP RULER LAB (MISCELLANEOUS) ×3 IMPLANT
NDL FILTER BLUNT 18X1 1/2 (NEEDLE) ×1 IMPLANT
NEEDLE FILTER BLUNT 18X 1/2SAF (NEEDLE) ×2
NEEDLE FILTER BLUNT 18X1 1/2 (NEEDLE) ×1 IMPLANT
PACK CATARACT BRASINGTON (MISCELLANEOUS) ×3 IMPLANT
PACK EYE AFTER SURG (MISCELLANEOUS) ×3 IMPLANT
PACK OPTHALMIC (MISCELLANEOUS) ×3 IMPLANT
SYR 3ML LL SCALE MARK (SYRINGE) ×3 IMPLANT
SYR 5ML LL (SYRINGE) ×3 IMPLANT
SYR TB 1ML LUER SLIP (SYRINGE) ×3 IMPLANT
WATER STERILE IRR 500ML POUR (IV SOLUTION) ×3 IMPLANT
WIPE NON LINTING 3.25X3.25 (MISCELLANEOUS) ×3 IMPLANT

## 2018-10-01 NOTE — H&P (Signed)

## 2018-10-01 NOTE — Anesthesia Preprocedure Evaluation (Signed)
Anesthesia Evaluation  Patient identified by MRN, date of birth, ID band Patient awake    Reviewed: Allergy & Precautions, H&P , NPO status , Patient's Chart, lab work & pertinent test results  History of Anesthesia Complications (+) history of anesthetic complications (Patient describes having a vasovagal response after anal fistulotomy. States he stood up and passed out in the recovery room)  Airway Mallampati: II  TM Distance: >3 FB Neck ROM: full    Dental no notable dental hx.    Pulmonary neg pulmonary ROS,    breath sounds clear to auscultation       Cardiovascular Exercise Tolerance: Good negative cardio ROS   Rhythm:regular Rate:Normal     Neuro/Psych Anxiety    GI/Hepatic negative GI ROS, Neg liver ROS,   Endo/Other  Prostate CA  Renal/GU      Musculoskeletal   Abdominal   Peds  Hematology   Anesthesia Other Findings   Reproductive/Obstetrics                             Anesthesia Physical  Anesthesia Plan  ASA: II  Anesthesia Plan: MAC   Post-op Pain Management:    Induction:   PONV Risk Score and Plan:   Airway Management Planned:   Additional Equipment:   Intra-op Plan:   Post-operative Plan:   Informed Consent: I have reviewed the patients History and Physical, chart, labs and discussed the procedure including the risks, benefits and alternatives for the proposed anesthesia with the patient or authorized representative who has indicated his/her understanding and acceptance.       Plan Discussed with: CRNA  Anesthesia Plan Comments:         Anesthesia Quick Evaluation

## 2018-10-01 NOTE — Op Note (Signed)
LOCATION:  [place]   PREOPERATIVE DIAGNOSIS:  Nuclear sclerotic cataract of the right eye.  H25.11   POSTOPERATIVE DIAGNOSIS:  Nuclear sclerotic cataract of the right eye.   PROCEDURE:  Phacoemulsification with Toric posterior chamber intraocular lens placement of the right eye.   LENS:   Implant Name Type Inv. Item Serial No. Manufacturer Lot No. LRB No. Used Action  LENS IOL ACRYSOF IQ 20.0 - [id number] Intraocular Lens LENS IOL ACRYSOF IQ 20.0 [id number] ALCON  Right 1 Implanted     [id number] 20.0 Toric intraocular lens with 1.5 diopters of cylindrical power with axis orientation at 0 degrees.   ULTRASOUND TIME: 12 % of 1 minutes, 29 seconds.  CDE 11.2   SURGEON:  Wyonia Hough, MD   ANESTHESIA: Topical with tetracaine drops and 2% Xylocaine jelly, augmented with 1% preservative-free intracameral lidocaine. .   COMPLICATIONS:  None.   DESCRIPTION OF PROCEDURE:  The patient was identified in the holding room and transported to the operating suite and placed in the supine position under the operating microscope.  The right eye was identified as the operative eye, and it was prepped and draped in the usual sterile ophthalmic fashion.    A clear-corneal paracentesis incision was made at the 12:00 position.  0.5 ml of preservative-free 1% lidocaine was injected into the anterior chamber. The anterior chamber was filled with Viscoat.  A 2.4 millimeter near clear corneal incision was then made at the 9:00 position.  A cystotome and capsulorrhexis forceps were then used to make a curvilinear capsulorrhexis.  Hydrodissection and hydrodelineation were then performed using balanced salt solution.   Phacoemulsification was then used in stop and chop fashion to remove the lens, nucleus and epinucleus.  The remaining cortex was aspirated using the irrigation and aspiration handpiece.  Provisc viscoelastic was then placed into the capsular bag to distend it for lens placement.   The Verion digital marker was used to align the implant at the intended axis.   A Toric lens was then injected into the capsular bag.  It was rotated clockwise until the axis marks on the lens were approximately 15 degrees in the counterclockwise direction to the intended alignment.  The viscoelastic was aspirated from the eye using the irrigation aspiration handpiece.  Then, a Koch spatula through the sideport incision was used to rotate the lens in a clockwise direction until the axis markings of the intraocular lens were lined up with the Verion alignment.  Balanced salt solution was then used to hydrate the wounds. Cefuroxime 0.1 ml of a 10mg /ml solution was injected into the anterior chamber for a dose of 1 mg of intracameral antibiotic at the completion of the case.    The eye was noted to have a physiologic pressure and there was no wound leak noted.   Timolol and Brimonidine drops were applied to the eye.  The patient was taken to the recovery room in stable condition having had no complications of anesthesia or surgery.  Tesla Keeler 10/01/2018, 2:05 PM

## 2018-10-01 NOTE — Anesthesia Procedure Notes (Signed)
Procedure Name: MAC Performed by: Arnice Vanepps M, CRNA Pre-anesthesia Checklist: Patient identified, Emergency Drugs available, Suction available, Timeout performed and Patient being monitored Patient Re-evaluated:Patient Re-evaluated prior to induction Oxygen Delivery Method: Nasal cannula Placement Confirmation: positive ETCO2       

## 2018-10-01 NOTE — Transfer of Care (Signed)
Immediate Anesthesia Transfer of Care Note  Patient: Vincent Schneider  Procedure(s) Performed: CATARACT EXTRACTION PHACO AND INTRAOCULAR LENS PLACEMENT (IOC) COMPLICATED  RIGHT TORIC LENS (Right Eye)  Patient Location: PACU  Anesthesia Type: MAC  Level of Consciousness: awake, alert  and patient cooperative  Airway and Oxygen Therapy: Patient Spontanous Breathing and Patient connected to supplemental oxygen  Post-op Assessment: Post-op Vital signs reviewed, Patient's Cardiovascular Status Stable, Respiratory Function Stable, Patent Airway and No signs of Nausea or vomiting  Post-op Vital Signs: Reviewed and stable  Complications: No apparent anesthesia complications

## 2018-10-01 NOTE — Anesthesia Postprocedure Evaluation (Signed)
Anesthesia Post Note  Patient: Vincent Schneider  Procedure(s) Performed: CATARACT EXTRACTION PHACO AND INTRAOCULAR LENS PLACEMENT (IOC) COMPLICATED  RIGHT TORIC LENS (Right Eye)  Patient location during evaluation: PACU Anesthesia Type: MAC Level of consciousness: awake and alert Pain management: pain level controlled Vital Signs Assessment: post-procedure vital signs reviewed and stable Respiratory status: spontaneous breathing, nonlabored ventilation, respiratory function stable and patient connected to nasal cannula oxygen Cardiovascular status: stable and blood pressure returned to baseline Postop Assessment: no apparent nausea or vomiting Anesthetic complications: no    Veda Canning

## 2018-10-02 ENCOUNTER — Encounter: Payer: Self-pay | Admitting: Ophthalmology

## 2019-01-09 ENCOUNTER — Other Ambulatory Visit
Admission: RE | Admit: 2019-01-09 | Discharge: 2019-01-09 | Disposition: A | Discharge status: Home / Self Care | Payer: Medicare Other | Source: Ambulatory Visit | Attending: Internal Medicine | Admitting: Internal Medicine

## 2019-01-09 DIAGNOSIS — Z01812 Encounter for preprocedural laboratory examination: Secondary | ICD-10-CM | POA: Insufficient documentation

## 2019-01-09 DIAGNOSIS — K648 Other hemorrhoids: Secondary | ICD-10-CM | POA: Insufficient documentation

## 2019-01-09 DIAGNOSIS — Z20828 Contact with and (suspected) exposure to other viral communicable diseases: Secondary | ICD-10-CM | POA: Insufficient documentation

## 2019-01-09 DIAGNOSIS — K5790 Diverticulosis of intestine, part unspecified, without perforation or abscess without bleeding: Secondary | ICD-10-CM | POA: Diagnosis not present

## 2019-01-09 DIAGNOSIS — K5909 Other constipation: Secondary | ICD-10-CM | POA: Insufficient documentation

## 2019-01-10 LAB — SARS CORONAVIRUS 2 (TAT 6-24 HRS): SARS Coronavirus 2: NEGATIVE

## 2019-01-12 ENCOUNTER — Encounter: Payer: Self-pay | Admitting: *Deleted

## 2019-01-13 ENCOUNTER — Ambulatory Visit
Admission: RE | Admit: 2019-01-13 | Discharge: 2019-01-13 | Disposition: A | Discharge status: Home / Self Care | Payer: Medicare Other | Attending: Internal Medicine | Admitting: Internal Medicine

## 2019-01-13 ENCOUNTER — Other Ambulatory Visit: Payer: Self-pay

## 2019-01-13 ENCOUNTER — Encounter
Admission: RE | Disposition: A | Discharge status: Home / Self Care | Payer: Self-pay | Source: Home / Self Care | Attending: Internal Medicine

## 2019-01-13 ENCOUNTER — Ambulatory Visit: Payer: Medicare Other | Admitting: Certified Registered Nurse Anesthetist

## 2019-01-13 DIAGNOSIS — Z79899 Other long term (current) drug therapy: Secondary | ICD-10-CM | POA: Insufficient documentation

## 2019-01-13 DIAGNOSIS — K5730 Diverticulosis of large intestine without perforation or abscess without bleeding: Secondary | ICD-10-CM | POA: Diagnosis not present

## 2019-01-13 DIAGNOSIS — Z7982 Long term (current) use of aspirin: Secondary | ICD-10-CM | POA: Diagnosis not present

## 2019-01-13 DIAGNOSIS — Z1211 Encounter for screening for malignant neoplasm of colon: Secondary | ICD-10-CM | POA: Diagnosis not present

## 2019-01-13 DIAGNOSIS — E785 Hyperlipidemia, unspecified: Secondary | ICD-10-CM | POA: Insufficient documentation

## 2019-01-13 DIAGNOSIS — Z85828 Personal history of other malignant neoplasm of skin: Secondary | ICD-10-CM | POA: Insufficient documentation

## 2019-01-13 DIAGNOSIS — K640 First degree hemorrhoids: Secondary | ICD-10-CM | POA: Diagnosis not present

## 2019-01-13 DIAGNOSIS — Z8546 Personal history of malignant neoplasm of prostate: Secondary | ICD-10-CM | POA: Diagnosis not present

## 2019-01-13 DIAGNOSIS — F419 Anxiety disorder, unspecified: Secondary | ICD-10-CM | POA: Diagnosis not present

## 2019-01-13 DIAGNOSIS — H409 Unspecified glaucoma: Secondary | ICD-10-CM | POA: Insufficient documentation

## 2019-01-13 DIAGNOSIS — N400 Enlarged prostate without lower urinary tract symptoms: Secondary | ICD-10-CM | POA: Diagnosis not present

## 2019-01-13 HISTORY — PX: COLONOSCOPY WITH PROPOFOL: SHX5780

## 2019-01-13 SURGERY — COLONOSCOPY WITH PROPOFOL
Anesthesia: General

## 2019-01-13 MED ORDER — PROPOFOL 10 MG/ML IV BOLUS
INTRAVENOUS | Status: DC | PRN
  Administered 2019-01-13: 80 mg via INTRAVENOUS

## 2019-01-13 MED ORDER — LIDOCAINE HCL (CARDIAC) PF 100 MG/5ML IV SOSY
PREFILLED_SYRINGE | INTRAVENOUS | Status: DC | PRN
  Administered 2019-01-13: 50 mg via INTRAVENOUS

## 2019-01-13 MED ORDER — LIDOCAINE HCL (PF) 2 % IJ SOLN
INTRAMUSCULAR | Status: AC
  Filled 2019-01-13: qty 10

## 2019-01-13 MED ORDER — PROPOFOL 500 MG/50ML IV EMUL
INTRAVENOUS | Status: AC
  Filled 2019-01-13: qty 50

## 2019-01-13 MED ORDER — SODIUM CHLORIDE 0.9 % IV SOLN
INTRAVENOUS | Status: DC
  Administered 2019-01-13: 09:00:00 via INTRAVENOUS

## 2019-01-13 MED ORDER — PROPOFOL 500 MG/50ML IV EMUL
INTRAVENOUS | Status: DC | PRN
  Administered 2019-01-13: 120 ug/kg/min via INTRAVENOUS

## 2019-01-13 NOTE — Anesthesia Preprocedure Evaluation (Signed)
Anesthesia Evaluation  Patient identified by MRN, date of birth, ID band Patient awake    Reviewed: Allergy & Precautions, H&P , NPO status , Patient's Chart, lab work & pertinent test results, reviewed documented beta blocker date and time   History of Anesthesia Complications (+) Family history of anesthesia reaction and history of anesthetic complications  Airway Mallampati: II   Neck ROM: full    Dental  (+) Poor Dentition   Pulmonary neg pulmonary ROS,    Pulmonary exam normal        Cardiovascular Exercise Tolerance: Good negative cardio ROS Normal cardiovascular exam Rhythm:regular Rate:Normal     Neuro/Psych Anxiety negative neurological ROS  negative psych ROS   GI/Hepatic negative GI ROS, Neg liver ROS,   Endo/Other  negative endocrine ROS  Renal/GU negative Renal ROS  negative genitourinary   Musculoskeletal   Abdominal   Peds  Hematology negative hematology ROS (+)   Anesthesia Other Findings Past Medical History: No date: Anxiety No date: BPH (benign prostatic hyperplasia) No date: Complication of anesthesia     Comment:  Patient describes having a vasovagal response after anal              fistulotomy. States he stood up and passed out in the               recovery room No date: Family history of adverse reaction to anesthesia No date: Glaucoma 2007: History of basal cell carcinoma     Comment:  forehead No date: Hyperlipemia No date: Insomnia No date: Prostate cancer ([place]) No date: PSA elevation Past Surgical History: No date: ANAL FISSURE REPAIR 09/10/2018: CATARACT EXTRACTION W/PHACO; Left     Comment:  Procedure: CATARACT EXTRACTION PHACO AND INTRAOCULAR               LENS PLACEMENT ([place]) COMPLICATED LEFT;  Surgeon:               Leandrew Koyanagi, MD;  Location: [place];              Service: Ophthalmology;  Laterality: Left;  OMIDRIA 10/01/2018: CATARACT EXTRACTION  W/PHACO; Right     Comment:  Procedure: CATARACT EXTRACTION PHACO AND INTRAOCULAR               LENS PLACEMENT ([place]) COMPLICATED  RIGHT TORIC LENS;                Surgeon: Leandrew Koyanagi, MD;  Location: [place];  Service: Ophthalmology;  Laterality:               Right; No date: COLONOSCOPY No date: HERNIA REPAIR No date: TONSILLECTOMY   Reproductive/Obstetrics negative OB ROS                             Anesthesia Physical Anesthesia Plan  ASA: II  Anesthesia Plan: General   Post-op Pain Management:    Induction:   PONV Risk Score and Plan:   Airway Management Planned:   Additional Equipment:   Intra-op Plan:   Post-operative Plan:   Informed Consent: I have reviewed the patients History and Physical, chart, labs and discussed the procedure including the risks, benefits and alternatives for the proposed anesthesia with the patient or authorized representative who has indicated his/her understanding and acceptance.     Dental Advisory Given  Plan Discussed with: CRNA  Anesthesia Plan Comments:         Anesthesia Quick Evaluation

## 2019-01-13 NOTE — H&P (Signed)
Outpatient short stay form Pre-procedure 01/13/2019 8:47 AM Cena Bruhn K. Alice Reichert, M.D.  Primary Physician: Meriel Flavors, M.D.  Reason for visit:  Colon cancer screening  History of present illness:  Patient with hx of constipation presents for colon cancer screening.Denies bleeding, abdominal pain or weight loss.    Current Facility-Administered Medications:  .  0.9 %  sodium chloride infusion, , Intravenous, Continuous, [place], Benay Pike, MD, Last Rate: 20 mL/hr at 01/13/19 0845  Medications Prior to Admission  Medication Sig Dispense Refill Last Dose  . latanoprost (XALATAN) 0.005 % ophthalmic solution Apply to eye.   01/12/2019 at Unknown time  . Melatonin 5 MG CAPS Take by mouth.   Past Week at Unknown time  . Multiple Vitamins-Minerals (MEGA MULTIVITAMIN FOR MEN PO) Take by mouth.   01/12/2019 at Unknown time  . Omega-3 Fatty Acids (FISH OIL CONCENTRATE) 300 MG CAPS Take by mouth.   01/12/2019 at Unknown time  . simvastatin (ZOCOR) 10 MG tablet    01/11/2019 at Unknown time  . tamsulosin (FLOMAX) 0.4 MG CAPS capsule Take by mouth.   01/12/2019 at Unknown time  . traZODone (DESYREL) 150 MG tablet    01/12/2019 at Unknown time  . aspirin EC 81 MG tablet Take by mouth.   01/11/2019  . B Complex-C-Iron (SUPER B-COMPLEX/IRON/VITAMIN C PO) Take by mouth.   01/11/2019  . calcium-vitamin D 250-100 MG-UNIT tablet Take 1 tablet by mouth 2 (two) times daily. 630 mg Ca+500 IU D3   01/11/2019     No Known Allergies   Past Medical History:  Diagnosis Date  . Anxiety   . BPH (benign prostatic hyperplasia)   . Complication of anesthesia    Patient describes having a vasovagal response after anal fistulotomy. States he stood up and passed out in the recovery room  . Family history of adverse reaction to anesthesia   . Glaucoma   . History of basal cell carcinoma 2007   forehead  . Hyperlipemia   . Insomnia   . Prostate cancer ([place])   . PSA elevation     Review of systems:  Otherwise  negative.    Physical Exam  Gen: Alert, oriented. Appears stated age.  HEENT: [place]/AT. PERRLA. Lungs: CTA, no wheezes. CV: RR nl S1, S2. Abd: soft, benign, no masses. BS+ Ext: No edema. Pulses 2+    Planned procedures: Proceed with colonoscopy. The patient understands the nature of the planned procedure, indications, risks, alternatives and potential complications including but not limited to bleeding, infection, perforation, damage to internal organs and possible oversedation/side effects from anesthesia. The patient agrees and gives consent to proceed.  Please refer to procedure notes for findings, recommendations and patient disposition/instructions.     Ovie Eastep K. Alice Reichert, M.D. Gastroenterology 01/13/2019  8:47 AM

## 2019-01-13 NOTE — Anesthesia Post-op Follow-up Note (Signed)
Anesthesia QCDR form completed.        

## 2019-01-13 NOTE — Anesthesia Postprocedure Evaluation (Signed)
Anesthesia Post Note  Patient: Vincent Schneider  Procedure(s) Performed: COLONOSCOPY WITH PROPOFOL (N/A )  Patient location during evaluation: PACU Anesthesia Type: General Level of consciousness: awake and alert Pain management: pain level controlled Vital Signs Assessment: post-procedure vital signs reviewed and stable Respiratory status: spontaneous breathing, nonlabored ventilation, respiratory function stable and patient connected to nasal cannula oxygen Cardiovascular status: blood pressure returned to baseline and stable Postop Assessment: no apparent nausea or vomiting Anesthetic complications: no     Last Vitals:  Vitals:   01/13/19 0842 01/13/19 0920  BP: (!) 137/99 100/61  Pulse: 80 63  Resp: 18 11  Temp: 37 C 37 C  SpO2: 100% 98%    Last Pain:  Vitals:   01/13/19 0920  TempSrc: Oral  PainSc:                  Molli Barrows

## 2019-01-13 NOTE — Op Note (Signed)
[facility] Gastroenterology Patient Name: Vincent Schneider Procedure Date: 01/13/2019 8:50 AM MRN: [id number] Account #: [id number] Date of Birth: [birth year] Admit Type: Outpatient Age: 78 Room: [facility] ENDO ROOM 2 Gender: Male Note Status: Finalized Procedure:            Colonoscopy Indications:          Screening for colorectal malignant neoplasm Providers:            Benay Pike. Alice Reichert MD, MD Referring MD:         Dion Body (Referring MD) Medicines:            Propofol per Anesthesia Complications:        No immediate complications. Procedure:            Pre-Anesthesia Assessment:                       - The risks and benefits of the procedure and the                        sedation options and risks were discussed with the                        patient. All questions were answered and informed                        consent was obtained.                       - Patient identification and proposed procedure were                        verified prior to the procedure by the nurse. The                        procedure was verified in the procedure room.                       - ASA Grade Assessment: III - A patient with severe                        systemic disease.                       - After reviewing the risks and benefits, the patient                        was deemed in satisfactory condition to undergo the                        procedure.                       After obtaining informed consent, the colonoscope was                        passed under direct vision. Throughout the procedure,                        the patient's blood pressure, pulse, and oxygen  saturations were monitored continuously. The                        Colonoscope was introduced through the anus and                        advanced to the the cecum, identified by appendiceal                        orifice and ileocecal valve. The colonoscopy was               somewhat difficult due to multiple diverticula in the                        colon. Successful completion of the procedure was aided                        by withdrawing and reinserting the scope. The patient                        tolerated the procedure well. The quality of the bowel                        preparation was adequate. The ileocecal valve,                        appendiceal orifice, and rectum were photographed. Findings:      The perianal and digital rectal examinations were normal. Pertinent       negatives include normal sphincter tone and no palpable rectal lesions.      Multiple small and large-mouthed diverticula were found in the sigmoid       colon. There was no evidence of diverticular bleeding.      Many small-mouthed diverticula were found in the descending colon,       transverse colon and ascending colon.      Non-bleeding internal hemorrhoids were found during retroflexion. The       hemorrhoids were Grade I (internal hemorrhoids that do not prolapse).      The exam was otherwise without abnormality. Impression:           - Severe diverticulosis in the sigmoid colon. There was                        no evidence of diverticular bleeding.                       - Diverticulosis in the descending colon, in the                        transverse colon and in the ascending colon.                       - Non-bleeding internal hemorrhoids.                       - The examination was otherwise normal.                       - No specimens collected. Recommendation:       - Patient has a contact number available for  emergencies. The signs and symptoms of potential                        delayed complications were discussed with the patient.                        Return to normal activities tomorrow. Written discharge                        instructions were provided to the patient.                       - Resume previous diet.                        - Continue present medications.                       - No repeat colonoscopy due to current age (37 years or                        older) and the absence of colonic polyps.                       - Return to physician assistant in 3 months. Procedure Code(s):    --- Professional ---                       [id number], Colorectal cancer screening; colonoscopy on                        individual not meeting criteria for high risk Diagnosis Code(s):    --- Professional ---                       K57.30, Diverticulosis of large intestine without                        perforation or abscess without bleeding                       K64.0, First degree hemorrhoids                       Z12.11, Encounter for screening for malignant neoplasm                        of colon CPT copyright 2019 American Medical Association. All rights reserved. The codes documented in this report are preliminary and upon coder review may  be revised to meet current compliance requirements. Efrain Sella MD, MD 01/13/2019 9:23:21 AM This report has been signed electronically. Number of Addenda: 0 Note Initiated On: 01/13/2019 8:50 AM Scope Withdrawal Time: 0 hours 8 minutes 12 seconds  Total Procedure Duration: 0 hours 19 minutes 32 seconds  Estimated Blood Loss: Estimated blood loss: none.      [facility]

## 2019-01-13 NOTE — Transfer of Care (Signed)
Immediate Anesthesia Transfer of Care Note  Patient: Vincent Schneider  Procedure(s) Performed: COLONOSCOPY WITH PROPOFOL (N/A )  Patient Location: PACU and Endoscopy Unit  Anesthesia Type:General  Level of Consciousness: drowsy  Airway & Oxygen Therapy: Patient Spontanous Breathing  Post-op Assessment: Report given to RN and Post -op Vital signs reviewed and stable  Post vital signs: Reviewed and stable  Last Vitals:  Vitals Value Taken Time  BP 100/61 01/13/19 0924  Temp    Pulse 64 01/13/19 0925  Resp 11 01/13/19 0925  SpO2 98 % 01/13/19 0925  Vitals shown include unvalidated device data.  Last Pain:  Vitals:   01/13/19 0842  TempSrc: Oral  PainSc: 0-No pain         Complications: No apparent anesthesia complications

## 2019-01-13 NOTE — Interval H&P Note (Signed)
History and Physical Interval Note:  01/13/2019 8:49 AM  Vincent Schneider  has presented today for surgery, with the diagnosis of CHANGE IN BOWEL HABITS,CHRONIC CONSTIPATION.  The various methods of treatment have been discussed with the patient and family. After consideration of risks, benefits and other options for treatment, the patient has consented to  Procedure(s): COLONOSCOPY WITH PROPOFOL (N/A) as a surgical intervention.  The patient's history has been reviewed, patient examined, no change in status, stable for surgery.  I have reviewed the patient's chart and labs.  Questions were answered to the patient's satisfaction.     [name], [place]

## 2019-12-26 ENCOUNTER — Emergency Department: Payer: Medicare Other

## 2019-12-26 ENCOUNTER — Observation Stay
Admission: EM | Admit: 2019-12-26 | Discharge: 2019-12-27 | Disposition: A | Discharge status: Home / Self Care | Payer: Medicare Other | Attending: Obstetrics and Gynecology | Admitting: Obstetrics and Gynecology

## 2019-12-26 ENCOUNTER — Other Ambulatory Visit: Payer: Self-pay

## 2019-12-26 DIAGNOSIS — R55 Syncope and collapse: Secondary | ICD-10-CM | POA: Diagnosis not present

## 2019-12-26 DIAGNOSIS — Z8546 Personal history of malignant neoplasm of prostate: Secondary | ICD-10-CM | POA: Diagnosis not present

## 2019-12-26 DIAGNOSIS — I10 Essential (primary) hypertension: Secondary | ICD-10-CM | POA: Diagnosis not present

## 2019-12-26 DIAGNOSIS — W19XXXA Unspecified fall, initial encounter: Secondary | ICD-10-CM

## 2019-12-26 DIAGNOSIS — Z20822 Contact with and (suspected) exposure to covid-19: Secondary | ICD-10-CM | POA: Insufficient documentation

## 2019-12-26 DIAGNOSIS — N400 Enlarged prostate without lower urinary tract symptoms: Secondary | ICD-10-CM | POA: Insufficient documentation

## 2019-12-26 DIAGNOSIS — Z7982 Long term (current) use of aspirin: Secondary | ICD-10-CM | POA: Diagnosis not present

## 2019-12-26 DIAGNOSIS — Z79899 Other long term (current) drug therapy: Secondary | ICD-10-CM | POA: Diagnosis not present

## 2019-12-26 DIAGNOSIS — E871 Hypo-osmolality and hyponatremia: Secondary | ICD-10-CM | POA: Diagnosis not present

## 2019-12-26 HISTORY — DX: Essential (primary) hypertension: I10

## 2019-12-26 LAB — COMPREHENSIVE METABOLIC PANEL
ALT: 17 U/L (ref 0–44)
AST: 37 U/L (ref 15–41)
Albumin: 4.1 g/dL (ref 3.5–5.0)
Alkaline Phosphatase: 46 U/L (ref 38–126)
Anion gap: 9 (ref 5–15)
BUN: 13 mg/dL (ref 8–23)
CO2: 26 mmol/L (ref 22–32)
Calcium: 8.9 mg/dL (ref 8.9–10.3)
Chloride: 91 mmol/L — ABNORMAL LOW (ref 98–111)
Creatinine, Ser: 0.74 mg/dL (ref 0.61–1.24)
GFR calc Af Amer: >60 mL/min (ref >60)
GFR calc non Af Amer: >60 mL/min (ref >60)
Glucose, Bld: 131 mg/dL — ABNORMAL HIGH (ref 70–99)
Potassium: 4.5 mmol/L (ref 3.5–5.1)
Sodium: 126 mmol/L — ABNORMAL LOW (ref 135–145)
Total Bilirubin: 1.4 mg/dL — ABNORMAL HIGH (ref 0.3–1.2)
Total Protein: 7 g/dL (ref 6.5–8.1)

## 2019-12-26 LAB — SARS CORONAVIRUS 2 BY RT PCR (HOSPITAL ORDER, PERFORMED IN ~~LOC~~ HOSPITAL LAB): SARS Coronavirus 2: NEGATIVE

## 2019-12-26 LAB — CBC
HCT: 40.3 % (ref 39.0–52.0)
Hemoglobin: 14.3 g/dL (ref 13.0–17.0)
MCH: 32.1 pg (ref 26.0–34.0)
MCHC: 35.5 g/dL (ref 30.0–36.0)
MCV: 90.6 fL (ref 80.0–100.0)
Platelets: 164 10*3/uL (ref 150–400)
RBC: 4.45 MIL/uL (ref 4.22–5.81)
RDW: 12.6 % (ref 11.5–15.5)
WBC: 10.7 10*3/uL — ABNORMAL HIGH (ref 4.0–10.5)
nRBC: 0 % (ref 0.0–0.2)

## 2019-12-26 LAB — TROPONIN I (HIGH SENSITIVITY): Troponin I (High Sensitivity): 6 ng/L (ref <18)

## 2019-12-26 LAB — TSH: TSH: 1.47 u[IU]/mL (ref 0.350–4.500)

## 2019-12-26 LAB — MAGNESIUM: Magnesium: 2.2 mg/dL (ref 1.7–2.4)

## 2019-12-26 MED ORDER — SIMVASTATIN 20 MG PO TABS
10.0000 mg | ORAL_TABLET | Freq: Every day | ORAL | Status: DC
  Administered 2019-12-26: 10 mg via ORAL
  Filled 2019-12-26 (×2): qty 1

## 2019-12-26 MED ORDER — ACETAMINOPHEN 650 MG RE SUPP: 650.0000 mg | Freq: Four times a day (QID) | RECTAL | Status: DC | PRN

## 2019-12-26 MED ORDER — LATANOPROST 0.005 % OP SOLN
1.0000 [drp] | Freq: Every day | OPHTHALMIC | Status: DC
  Filled 2019-12-26 (×2): qty 2.5

## 2019-12-26 MED ORDER — TAMSULOSIN HCL 0.4 MG PO CAPS
0.4000 mg | ORAL_CAPSULE | Freq: Every day | ORAL | Status: DC
  Administered 2019-12-26: 0.4 mg via ORAL
  Filled 2019-12-26 (×2): qty 1

## 2019-12-26 MED ORDER — ENOXAPARIN SODIUM 40 MG/0.4ML ~~LOC~~ SOLN
40.0000 mg | SUBCUTANEOUS | Status: DC
  Administered 2019-12-26: 40 mg via SUBCUTANEOUS
  Filled 2019-12-26 (×2): qty 0.4

## 2019-12-26 MED ORDER — SODIUM CHLORIDE 0.9% FLUSH
3.0000 mL | Freq: Two times a day (BID) | INTRAVENOUS | Status: DC
  Administered 2019-12-26 – 2019-12-27 (×3): 3 mL via INTRAVENOUS

## 2019-12-26 MED ORDER — ACETAMINOPHEN 325 MG PO TABS: 650.0000 mg | ORAL_TABLET | Freq: Four times a day (QID) | ORAL | Status: DC | PRN

## 2019-12-26 MED ORDER — TRAZODONE HCL 50 MG PO TABS
150.0000 mg | ORAL_TABLET | Freq: Every day | ORAL | Status: DC
  Administered 2019-12-26: 150 mg via ORAL
  Filled 2019-12-26 (×2): qty 1

## 2019-12-26 MED ORDER — ONDANSETRON HCL 4 MG PO TABS: 4.0000 mg | ORAL_TABLET | Freq: Four times a day (QID) | ORAL | Status: DC | PRN

## 2019-12-26 MED ORDER — TAMSULOSIN HCL 0.4 MG PO CAPS
0.4000 mg | ORAL_CAPSULE | Freq: Every day | ORAL | Status: DC
  Filled 2019-12-26: qty 1

## 2019-12-26 MED ORDER — SODIUM CHLORIDE 0.9 % IV SOLN: INTRAVENOUS | Status: DC

## 2019-12-26 MED ORDER — ASPIRIN EC 81 MG PO TBEC
81.0000 mg | DELAYED_RELEASE_TABLET | Freq: Every day | ORAL | Status: DC
  Administered 2019-12-26: 81 mg via ORAL
  Filled 2019-12-26: qty 1

## 2019-12-26 MED ORDER — SODIUM CHLORIDE 0.9 % IV BOLUS
500.0000 mL | Freq: Once | INTRAVENOUS | Status: AC
  Administered 2019-12-26: 500 mL via INTRAVENOUS

## 2019-12-26 MED ORDER — ONDANSETRON HCL 4 MG/2ML IJ SOLN: 4.0000 mg | Freq: Four times a day (QID) | INTRAMUSCULAR | Status: DC | PRN

## 2019-12-26 NOTE — ED Notes (Signed)
Report from christine. Pt taken to room 32, oriented to room and admission process. Pt provided with call bell, water, urinal.

## 2019-12-26 NOTE — ED Notes (Signed)
Cardiologist at bedside at this time.

## 2019-12-26 NOTE — ED Triage Notes (Signed)
Patient arrived via EMS from home. Patient is AOx4 and ambulatory. Patient called 911 due to having syncopal episode and having nausea. EMS arrived and patient presented with possible new 1st Degree Heart Block. Patient stated he got up this morning to use restroom felt lightheaded and fainted. Patient did start new BP medication 2 weeks ago for new dx of hypertension. Patient is under a lot of stress caring for wife who had dementia and broken hip.  4mg  Zofran given IV.  Patient did fall about 7 days ago on right hip, shoulder, and elbow trying to pick up a friend who fell. No bruising noted on head.

## 2019-12-26 NOTE — H&P (Signed)
History and Physical    JEVAUN STRICK VOZ:[id number] DOB: 1940-05-22 DOA: 12/26/2019  PCP: Dion Body, MD   Patient coming from: Home  I have personally briefly reviewed patient's old medical records in [place]  Chief Complaint: " I passed out"  HPI: Vincent Schneider is a 79 y.o. male with medical history significant for BPH, hypertension, anxiety disorder who presents to the ER by EMS for evaluation of a near syncopal episode.  Patient states that he has had 3 episodes, the initial one started early in the hours of the morning when he got up to use the bathroom.  He complained of feeling very dizzy and lightheaded and thinks he may have passed out.  He had nausea but denies having any vomiting, no constipation or diarrhea.  He went back to bed and woke up very diaphoretic and when he attempted to get up he felt very dizzy and lightheaded so he called 911.  He attributes his symptoms to what he had for dinner which she said made him very bloated and uncomfortable. He denies having any chest pain, no shortness of breath, no fever, no chills, no cough or any urinary symptoms. He was recently started on hydrochlorothiazide by his primary care provider for hypertension. Labs show sodium 126, potassium 4.5, chloride 90, bicarb 26, BUN 13, creatinine 0.74, calcium 9.9, magnesium 2.2, alkaline phosphatase 46, albumin 4.1,, ALT 17, troponin 6, white count 10.7, hemoglobin 14.3, hematocrit 40.3, MCV 90.6, RDW 12.6, platelet count 164, TSH 1.47 Chest x-ray reviewed by me shows no obvious infiltrate or effusion Twelve-lead EKG reviewed by me shows sinus rhythm with a first-degree AV block, incomplete right bundle branch block and left anterior fascicular block    ED Course: Patient is an 80 year old Caucasian male who presented to the ER by EMS for evaluation of a near syncopal episode at home.  Labs show sodium level of 126 and patient was recently started on hydrochlorothiazide.  He will  be admitted to the hospital for further evaluation.  Review of Systems: As per HPI otherwise 10 point review of systems negative.    Past Medical History:  Diagnosis Date  . Anxiety   . BPH (benign prostatic hyperplasia)   . Complication of anesthesia    Patient describes having a vasovagal response after anal fistulotomy. States he stood up and passed out in the recovery room  . Family history of adverse reaction to anesthesia   . Glaucoma   . History of basal cell carcinoma 2007   forehead  . Hyperlipemia   . Hypertension   . Insomnia   . Prostate cancer ([place])   . PSA elevation     Past Surgical History:  Procedure Laterality Date  . ANAL FISSURE REPAIR    . CATARACT EXTRACTION W/PHACO Left 09/10/2018   Procedure: CATARACT EXTRACTION PHACO AND INTRAOCULAR LENS PLACEMENT ([place]) COMPLICATED LEFT;  Surgeon: Leandrew Koyanagi, MD;  Location: [facility];  Service: Ophthalmology;  Laterality: Left;  OMIDRIA  . CATARACT EXTRACTION W/PHACO Right 10/01/2018   Procedure: CATARACT EXTRACTION PHACO AND INTRAOCULAR LENS PLACEMENT ([place]) COMPLICATED  RIGHT TORIC LENS;  Surgeon: Leandrew Koyanagi, MD;  Location: [facility];  Service: Ophthalmology;  Laterality: Right;  . COLONOSCOPY    . COLONOSCOPY WITH PROPOFOL N/A 01/13/2019   Procedure: COLONOSCOPY WITH PROPOFOL;  Surgeon: Toledo, Benay Pike, MD;  Location: [facility];  Service: Gastroenterology;  Laterality: N/A;  . HERNIA REPAIR    . TONSILLECTOMY  reports that he has never smoked. He has never used smokeless tobacco. He reports current alcohol use. He reports that he does not use drugs.  No Known Allergies  Family History  Problem Relation Age of Onset  . Cancer Neg Hx      Prior to Admission medications   Medication Sig Start Date End Date Taking? Authorizing Provider  aspirin EC 81 MG tablet Take 81 mg by mouth daily.     [provider]  hydrochlorothiazide (HYDRODIURIL) 12.5 MG tablet  Take 12.5 mg by mouth daily. 12/03/19   [provider]  latanoprost (XALATAN) 0.005 % ophthalmic solution Place 1 drop into both eyes at bedtime.    [provider]  simvastatin (ZOCOR) 10 MG tablet Take 10 mg by mouth daily at 6 PM.  05/05/15   [provider]  tamsulosin (FLOMAX) 0.4 MG CAPS capsule Take 0.4 mg by mouth daily.     [provider]  traZODone (DESYREL) 150 MG tablet Take 150 mg by mouth at bedtime.  08/29/15   [provider]    Physical Exam: Vitals:   12/26/19 1030 12/26/19 1100 12/26/19 1200 12/26/19 1300  BP: (!) 147/86 (!) 149/91 (!) 144/85 (!) 147/88  Pulse: 62 68 71 78  Resp: 17 (!) 22 16 16   Temp:      TempSrc:      SpO2: 100% 99% 100% 100%  Weight:      Height:         Vitals:   12/26/19 1030 12/26/19 1100 12/26/19 1200 12/26/19 1300  BP: (!) 147/86 (!) 149/91 (!) 144/85 (!) 147/88  Pulse: 62 68 71 78  Resp: 17 (!) 22 16 16   Temp:      TempSrc:      SpO2: 100% 99% 100% 100%  Weight:      Height:        Constitutional: NAD, alert and oriented x 3 Eyes: PERRL, lids and conjunctivae normal ENMT: Mucous membranes are moist.  Neck: normal, supple, no masses, no thyromegaly Respiratory: clear to auscultation bilaterally, no wheezing, no crackles. Normal respiratory effort. No accessory muscle use.  Cardiovascular: Regular rate and rhythm, no murmurs / rubs / gallops. No extremity edema. 2+ pedal pulses. No carotid bruits.  Abdomen: no tenderness, no masses palpated. No hepatosplenomegaly. Bowel sounds positive.  Musculoskeletal: no clubbing / cyanosis. No joint deformity upper and lower extremities.  Skin: no rashes, lesions, ulcers.  Neurologic: No gross focal neurologic deficit. Psychiatric: Normal mood and affect.   Labs on Admission: I have personally reviewed following labs and imaging studies  CBC: Recent Labs  Lab 12/26/19 0836  WBC 10.7*  HGB 14.3  HCT 40.3  MCV 90.6  PLT 875   Basic  Metabolic Panel: Recent Labs  Lab 12/26/19 0836  NA 126*  K 4.5  CL 91*  CO2 26  GLUCOSE 131*  BUN 13  CREATININE 0.74  CALCIUM 8.9  MG 2.2   GFR: Estimated Creatinine Clearance: 87.4 mL/min (by C-G formula based on SCr of 0.74 mg/dL). Liver Function Tests: Recent Labs  Lab 12/26/19 0836  AST 37  ALT 17  ALKPHOS 46  BILITOT 1.4*  PROT 7.0  ALBUMIN 4.1   No results for input(s): LIPASE, AMYLASE in the last 168 hours. No results for input(s): AMMONIA in the last 168 hours. Coagulation Profile: No results for input(s): INR, PROTIME in the last 168 hours. Cardiac Enzymes: No results for input(s): CKTOTAL, CKMB, CKMBINDEX, TROPONINI in the last 168  hours. BNP (last 3 results) No results for input(s): PROBNP in the last 8760 hours. HbA1C: No results for input(s): HGBA1C in the last 72 hours. CBG: No results for input(s): GLUCAP in the last 168 hours. Lipid Profile: No results for input(s): CHOL, HDL, LDLCALC, TRIG, CHOLHDL, LDLDIRECT in the last 72 hours. Thyroid Function Tests: Recent Labs    12/26/19 0836  TSH 1.470   Anemia Panel: No results for input(s): VITAMINB12, FOLATE, FERRITIN, TIBC, IRON, RETICCTPCT in the last 72 hours. Urine analysis:    Component Value Date/Time   COLORURINE Yellow 06/23/2013 0733   APPEARANCEUR Clear 06/23/2013 0733   LABSPEC 1.012 06/23/2013 0733   PHURINE 7.0 06/23/2013 0733   GLUCOSEU Negative 06/23/2013 0733   HGBUR Negative 06/23/2013 0733   BILIRUBINUR Negative 06/23/2013 0733   KETONESUR Negative 06/23/2013 0733   PROTEINUR Negative 06/23/2013 0733   NITRITE Negative 06/23/2013 0733   LEUKOCYTESUR Negative 06/23/2013 0733    Radiological Exams on Admission: DG Pelvis Portable  Result Date: 12/26/2019 CLINICAL DATA:  Fall EXAM: PORTABLE PELVIS 1-2 VIEWS COMPARISON:  None. FINDINGS: There is no evidence of pelvic fracture or diastasis. Moderate colonic stool burden. Pelvic phleboliths. Clips project over the pelvis.  Vascular calcifications. Degenerative changes lower lumbar spine. Limited evaluation of the sacrum secondary to overlying stool. IMPRESSION: 1. No acute osseous abnormality within the limitations of this single image. Electronically Signed   By: Valentino Saxon MD   On: 12/26/2019 10:11   DG Chest Port 1 View  Result Date: 12/26/2019 CLINICAL DATA:  Weakness EXAM: PORTABLE CHEST 1 VIEW COMPARISON:  June 23, 2013 FINDINGS: The cardiomediastinal silhouette is unchanged in contour.LEFT upper lung calcified granuloma. No pleural effusion. No pneumothorax. No acute pleuroparenchymal abnormality. Visualized abdomen is unremarkable. Multilevel degenerative changes of the thoracic spine. IMPRESSION: No acute cardiopulmonary abnormality. Electronically Signed   By: Valentino Saxon MD   On: 12/26/2019 09:09    EKG: Independently reviewed.  Sinus rhythm with a first-degree AV block, incomplete right bundle branch block and left anterior fascicular block     Assessment/Plan Principal Problem:   Syncope and collapse Active Problems:   Hyponatremia   BPH (benign prostatic hyperplasia)      Syncope and collapse Most likely vasovagal We will monitor patient closely on telemetry to rule out arrhythmias as the cause of his near syncopal episode Twelve-lead EKG shows a first-degree AV block he was said to have had a rhythm strip that showed bradycardia in the field Obtain 2D echocardiogram to assess LVEF and rule out aortic stenosis We will request cardiology consult    Hyponatremia Most likely related to hydrochlorothiazide use We will discontinue HCTZ Gentle IV fluid hydration Repeat sodium levels in a.m.    BPH Continue Flomax  DVT prophylaxis: Lovenox Code Status: Full code Family Communication: Greater than 50% of time was spent discussing plan of care with patient at the bedside.  All questions and concerns have been addressed. He verbalizes understanding and agrees with the  plan.  CODE STATUS was discussed and he is a DO NOT RESUSCITATE Disposition Plan: Back to previous home environment Consults called: Cardiology    Christinamarie Tall MD [facility]     12/26/2019, 1:13 PM

## 2019-12-26 NOTE — Consult Note (Signed)
Cardiology Consultation Note    Patient ID: Vincent Schneider, MRN: [id number], DOB/AGE: [birth year] 79 y.o. Admit date: 12/26/2019   Date of Consult: 12/26/2019 Primary Physician: Dion Body, MD Primary Cardiologist: none  Chief Complaint: syncope Reason for Consultation: syncope Requesting MD: dr. Francine Graven  HPI: Vincent Schneider is a 79 y.o. male with history of near syncope that occurred this morning.  Patient states that he felt bloated after eating dinner.  In the middle the night he felt lightheaded and nauseated.  He had no chest pain.  He had a mechanical fall 6 days ago causing right hip pain.  He denied palpitations or chest pain.  He had a history of a vasovagal response after after negative anal fistulotomy.  He has a history of hyperlipidemia hypertension.  He is on aspirin, tamsulosin as an outpatient.  EKG showed sinus rhythm with no ischemia.  EMS strips showed sinus rhythm with some artifact versus bradycardia in route.  Troponin was normal.  Covid negative.  He was relatively hyponatremic with a sodium of 126.  Renal function normal.  Potassium normal.  Hemoglobin normal.  White count mildly elevated 10.7.  Chest x-ray showed no acute cardiopulmonary disease.  Medically stable with blood pressure 937 systolic. Past Medical History:  Diagnosis Date  . Anxiety   . BPH (benign prostatic hyperplasia)   . Complication of anesthesia    Patient describes having a vasovagal response after anal fistulotomy. States he stood up and passed out in the recovery room  . Family history of adverse reaction to anesthesia   . Glaucoma   . History of basal cell carcinoma 2007   forehead  . Hyperlipemia   . Hypertension   . Insomnia   . Prostate cancer ([place])   . PSA elevation       Surgical History:  Past Surgical History:  Procedure Laterality Date  . ANAL FISSURE REPAIR    . CATARACT EXTRACTION W/PHACO Left 09/10/2018   Procedure: CATARACT EXTRACTION PHACO AND INTRAOCULAR LENS  PLACEMENT ([place]) COMPLICATED LEFT;  Surgeon: Leandrew Koyanagi, MD;  Location: [facility];  Service: Ophthalmology;  Laterality: Left;  [facility]A  . CATARACT EXTRACTION W/PHACO Right 10/01/2018   Procedure: CATARACT EXTRACTION PHACO AND INTRAOCULAR LENS PLACEMENT ([place]) COMPLICATED  RIGHT TORIC LENS;  Surgeon: Leandrew Koyanagi, MD;  Location: [facility];  Service: Ophthalmology;  Laterality: Right;  . COLONOSCOPY    . COLONOSCOPY WITH PROPOFOL N/A 01/13/2019   Procedure: COLONOSCOPY WITH PROPOFOL;  Surgeon: Toledo, Benay Pike, MD;  Location: [facility];  Service: Gastroenterology;  Laterality: N/A;  . HERNIA REPAIR    . TONSILLECTOMY       Home Meds: Prior to Admission medications   Medication Sig Start Date End Date Taking? Authorizing Provider  aspirin EC 81 MG tablet Take by mouth.    [provider]  B Complex-C-Iron (SUPER B-COMPLEX/IRON/VITAMIN C PO) Take by mouth.    [provider]  calcium-vitamin D 250-100 MG-UNIT tablet Take 1 tablet by mouth 2 (two) times daily. 630 mg Ca+500 IU D3    [provider]  latanoprost (XALATAN) 0.005 % ophthalmic solution Apply to eye.    [provider]  Melatonin 5 MG CAPS Take by mouth.    [provider]  Multiple Vitamins-Minerals (MEGA MULTIVITAMIN FOR MEN PO) Take by mouth.    [provider]  Omega-3 Fatty Acids (FISH OIL CONCENTRATE) 300 MG CAPS Take by mouth.    [provider]  simvastatin (ZOCOR)  10 MG tablet  05/05/15   [provider]  tamsulosin (FLOMAX) 0.4 MG CAPS capsule Take by mouth.    [provider]  traZODone (DESYREL) 150 MG tablet  08/29/15   [provider]    Inpatient Medications:     Allergies: No Known Allergies  Social History   Socioeconomic History  . Marital status: Married    Spouse name: Not on file  . Number of children: Not on file  . Years of education: Not on file  . Highest education  level: Not on file  Occupational History  . Not on file  Tobacco Use  . Smoking status: Never Smoker  . Smokeless tobacco: Never Used  Vaping Use  . Vaping Use: Never used  Substance and Sexual Activity  . Alcohol use: Yes    Comment: Liquor or wine once or twice a week  . Drug use: No  . Sexual activity: Not Currently    Birth control/protection: None  Other Topics Concern  . Not on file  Social History Narrative  . Not on file   Social Determinants of Health   Financial Resource Strain:   . Difficulty of Paying Living Expenses: Not on file  Food Insecurity:   . Worried About Charity fundraiser in the Last Year: Not on file  . Ran Out of Food in the Last Year: Not on file  Transportation Needs:   . Lack of Transportation (Medical): Not on file  . Lack of Transportation (Non-Medical): Not on file  Physical Activity:   . Days of Exercise per Week: Not on file  . Minutes of Exercise per Session: Not on file  Stress:   . Feeling of Stress : Not on file  Social Connections:   . Frequency of Communication with Friends and Family: Not on file  . Frequency of Social Gatherings with Friends and Family: Not on file  . Attends Religious Services: Not on file  . Active Member of Clubs or Organizations: Not on file  . Attends Archivist Meetings: Not on file  . Marital Status: Not on file  Intimate Partner Violence:   . Fear of Current or Ex-Partner: Not on file  . Emotionally Abused: Not on file  . Physically Abused: Not on file  . Sexually Abused: Not on file     Family History  Problem Relation Age of Onset  . Cancer Neg Hx      Review of Systems: A 12-system review of systems was performed and is negative except as noted in the HPI.  Labs: No results for input(s): CKTOTAL, CKMB, TROPONINI in the last 72 hours. Lab Results  Component Value Date   WBC 10.7 (H) 12/26/2019   HGB 14.3 12/26/2019   HCT 40.3 12/26/2019   MCV 90.6 12/26/2019   PLT 164  12/26/2019    Recent Labs  Lab 12/26/19 0836  NA 126*  K 4.5  CL 91*  CO2 26  BUN 13  CREATININE 0.74  CALCIUM 8.9  PROT 7.0  BILITOT 1.4*  ALKPHOS 46  ALT 17  AST 37  GLUCOSE 131*   Lab Results  Component Value Date   CHOL 123 06/24/2013   HDL 66 (H) 06/24/2013   LDLCALC 42 06/24/2013   TRIG 73 06/24/2013   No results found for: DDIMER  Radiology/Studies:  DG Pelvis Portable  Result Date: 12/26/2019 CLINICAL DATA:  Fall EXAM: PORTABLE PELVIS 1-2 VIEWS COMPARISON:  None. FINDINGS: There is no evidence of pelvic  fracture or diastasis. Moderate colonic stool burden. Pelvic phleboliths. Clips project over the pelvis. Vascular calcifications. Degenerative changes lower lumbar spine. Limited evaluation of the sacrum secondary to overlying stool. IMPRESSION: 1. No acute osseous abnormality within the limitations of this single image. Electronically Signed   By: Valentino Saxon MD   On: 12/26/2019 10:11   DG Chest Port 1 View  Result Date: 12/26/2019 CLINICAL DATA:  Weakness EXAM: PORTABLE CHEST 1 VIEW COMPARISON:  June 23, 2013 FINDINGS: The cardiomediastinal silhouette is unchanged in contour.LEFT upper lung calcified granuloma. No pleural effusion. No pneumothorax. No acute pleuroparenchymal abnormality. Visualized abdomen is unremarkable. Multilevel degenerative changes of the thoracic spine. IMPRESSION: No acute cardiopulmonary abnormality. Electronically Signed   By: Valentino Saxon MD   On: 12/26/2019 09:09    Wt Readings from Last 3 Encounters:  12/26/19 83.9 kg  01/13/19 83.9 kg  10/01/18 83 kg    EKG: Sinus rhythm no ischemia  Physical Exam:  Blood pressure (!) 147/86, pulse 62, temperature 98.2 F (36.8 C), temperature source Oral, resp. rate 17, height 6\' 3"  (1.905 m), weight 83.9 kg, SpO2 100 %. Body mass index is 23.12 kg/m. General: Well developed, well nourished, in no acute distress. Head: Normocephalic, atraumatic, sclera non-icteric, no xanthomas,  nares are without discharge.  Neck: Negative for carotid bruits. JVD not elevated. Lungs: Clear bilaterally to auscultation without wheezes, rales, or rhonchi. Breathing is unlabored. Heart: RRR with S1 S2. No murmurs, rubs, or gallops appreciated. Abdomen: Soft, non-tender, non-distended with normoactive bowel sounds. No hepatomegaly. No rebound/guarding. No obvious abdominal masses. Msk:  Strength and tone appear normal for age. Extremities: No clubbing or cyanosis. No edema.  Distal pedal pulses are 2+ and equal bilaterally. Neuro: Alert and oriented X 3. No facial asymmetry. No focal deficit. Moves all extremities spontaneously. Psych:  Responds to questions appropriately with a normal affect.     Assessment and Plan  79 year old male with prior cardiac history as well as had syncope during vasovagal episodes in the past who was admitted after feeling some gastric stress nausea since last p.m.  Felt lightheaded this morning and called EMS.  In route patient got bradycardic with abdominal cramping.  He is ruled out for myocardial infarction with a negative serum troponin.  Renal function was normal although was moderately hyponatremic with a sodium of 126.  He is currently hemodynamically stable.  1.  Near syncope-appears likely vasovagal although hyponatremia may be playing a role somewhat.  Is hemodynamically stable.  Will place him back on his tamsulosin at 0.4 mg daily and follow his heart rate, blood pressure.  Will schedule for an echocardiogram to evaluate for any structural valvular abnormalities.  Will follow on the monitor during this next 24 hours.  If stable will consider discharge with outpatient Holter monitor.  2.  Hyponatremia-etiology unclear.  Signed, Teodoro Spray MD 12/26/2019, 11:02 AM Pager: [phone]

## 2019-12-26 NOTE — ED Provider Notes (Addendum)
[facility] Emergency Department Provider Note   ____________________________________________    I have reviewed the triage vital signs and the nursing notes.   HISTORY  Chief Complaint Loss of Consciousness, Nausea, and Irregular Heart Beat     HPI Vincent Schneider is a 79 y.o. male with a history as described below presents after a near syncopal episode that occurred this morning.  Patient reports yesterday after eating dinner he felt bloated and uncomfortable and queasy.  When he got up to urinate in the middle the night he felt lightheaded and nauseated.  This continued through the night, this morning he felt like he was going to syncopized.  He denies chest pain.  No palpitations.  Did have a fall 6 days ago at which time he injured his right hip.  He has been ambulating on it however.   Past Medical History:  Diagnosis Date  . Anxiety   . BPH (benign prostatic hyperplasia)   . Complication of anesthesia    Patient describes having a vasovagal response after anal fistulotomy. States he stood up and passed out in the recovery room  . Family history of adverse reaction to anesthesia   . Glaucoma   . History of basal cell carcinoma 2007   forehead  . Hyperlipemia   . Hypertension   . Insomnia   . Prostate cancer ([place])   . PSA elevation     Patient Active Problem List   Diagnosis Date Noted  . Malignant neoplasm of prostate ([place]) 10/19/2015    Past Surgical History:  Procedure Laterality Date  . ANAL FISSURE REPAIR    . CATARACT EXTRACTION W/PHACO Left 09/10/2018   Procedure: CATARACT EXTRACTION PHACO AND INTRAOCULAR LENS PLACEMENT ([place]) COMPLICATED LEFT;  Surgeon: Leandrew Koyanagi, MD;  Location: [place];  Service: Ophthalmology;  Laterality: Left;  OMIDRIA  . CATARACT EXTRACTION W/PHACO Right 10/01/2018   Procedure: CATARACT EXTRACTION PHACO AND INTRAOCULAR LENS PLACEMENT ([place]) COMPLICATED  RIGHT TORIC LENS;  Surgeon:  Leandrew Koyanagi, MD;  Location: [place];  Service: Ophthalmology;  Laterality: Right;  . COLONOSCOPY    . COLONOSCOPY WITH PROPOFOL N/A 01/13/2019   Procedure: COLONOSCOPY WITH PROPOFOL;  Surgeon: Tol[place], Benay Pike, MD;  Location: [facility] ENDOSCOPY;  Service: Gastroenterology;  Laterality: N/A;  . HERNIA REPAIR    . TONSILLECTOMY      Prior to Admission medications   Medication Sig Start Date End Date Taking? Authorizing Provider  aspirin EC 81 MG tablet Take by mouth.    [provider]  B Complex-C-Iron (SUPER B-COMPLEX/IRON/VITAMIN C PO) Take by mouth.    [provider]  calcium-vitamin D 250-100 MG-UNIT tablet Take 1 tablet by mouth 2 (two) times daily. 630 mg Ca+500 IU D3    [provider]  latanoprost (XALATAN) 0.005 % ophthalmic solution Apply to eye.    [provider]  Melatonin 5 MG CAPS Take by mouth.    [provider]  Multiple Vitamins-Minerals (MEGA MULTIVITAMIN FOR MEN PO) Take by mouth.    [provider]  Omega-3 Fatty Acids (FISH OIL CONCENTRATE) 300 MG CAPS Take by mouth.    [provider]  simvastatin (ZOCOR) 10 MG tablet  05/05/15   [provider]  tamsulosin (FLOMAX) 0.4 MG CAPS capsule Take by mouth.    [provider]  traZODone (DESYREL) 150 MG tablet  08/29/15   [provider]     Allergies Patient has no known allergies.  Family History  Problem Relation Age of Onset  . Cancer Neg Hx     Social History Social History   Tobacco Use  . Smoking status: Never Smoker  . Smokeless tobacco: Never Used  Vaping Use  . Vaping Use: Never used  Substance Use Topics  . Alcohol use: Yes    Comment: Liquor or wine once or twice a week  . Drug use: No    Review of Systems  Constitutional: No fever/chills Eyes: No visual changes.  ENT: No sore throat. Cardiovascular: Denies chest pain. Respiratory: Denies shortness of breath. Gastrointestinal: No  abdominal pain, nausea as above Genitourinary: Negative for dysuria. Musculoskeletal: Hip pain as above Skin: Negative for rash. Neurological: Negative for headaches or weakness   ____________________________________________   PHYSICAL EXAM:  VITAL SIGNS: ED Triage Vitals  Enc Vitals Group     BP      Pulse      Resp      Temp      Temp src      SpO2      Weight      Height      Head Circumference      Peak Flow      Pain Score      Pain Loc      Pain Edu?      Excl. in [place]?     Constitutional: Alert and oriented.  Eyes: Conjunctivae are normal.  Head: Atraumatic. Nose: No congestion/rhinnorhea.   Cardiovascular: Normal rate, regular rhythm.  Good peripheral circulation. Respiratory: Normal respiratory effort.  No retractions.  Gastrointestinal: Soft and nontender. No distention.  No CVA tenderness.  Musculoskeletal: No pain with axial load on the right hip, warm and well perfused, normal circulation.  No bony other maladies palpated. Neurologic:  Normal speech and language. No gross focal neurologic deficits are appreciated.  Skin:  Skin is warm, dry and intact. No rash noted. Psychiatric: Mood and affect are normal. Speech and behavior are normal.  ____________________________________________   LABS (all labs ordered are listed, but only abnormal results are displayed)  Labs Reviewed  CBC - Abnormal; Notable for the following components:      Result Value   WBC 10.7 (*)    All other components within normal limits  COMPREHENSIVE METABOLIC PANEL - Abnormal; Notable for the following components:   Sodium 126 (*)    Chloride 91 (*)    Glucose, Bld 131 (*)    Total Bilirubin 1.4 (*)    All other components within normal limits  SARS CORONAVIRUS 2 BY RT PCR (HOSPITAL ORDER, [place] [facility])  MAGNESIUM  TSH  TROPONIN I (HIGH SENSITIVITY)   ____________________________________________  EKG ED ECG REPORT I, Lavonia Drafts, the  attending physician, personally viewed and interpreted this ECG.  Date: 12/26/2019  Rhythm: normal sinus rhythm QRS Axis: normal Intervals: Prolonged PR ST/T Wave abnormalities: normal Narrative Interpretation: no evidence of acute ischemia  ____________________________________________  RADIOLOGY  Chest x-ray viewed by me, no infiltrate effusion or pneumothorax ____________________________________________   PROCEDURES  Procedure(s) performed: No  .1-3 Lead EKG Interpretation Performed by: Lavonia Drafts, MD Authorized by: Lavonia Drafts, MD     Interpretation: normal     ECG rate assessment: normal     Rhythm: sinus rhythm     Ectopy: none     Conduction: abnormal     Abnormal conduction: 1st degree AV block       Critical Care performed: No ____________________________________________   INITIAL IMPRESSION /  ASSESSMENT AND PLAN / ED COURSE  Pertinent labs & imaging results that were available during my care of the patient were reviewed by me and considered in my medical decision making (see chart for details).  Patient presents after multiple near syncopal episodes.  Prolonged PR suggestive of first-degree AV block, no old EKG to compare this to.  Exam is overall reassuring at this time, no abdominal tenderness to palpation.  However he is mildly diaphoretic  We will place patient on the cardiac monitor, IV fluids infusing.  While we await labs  Lab work notable for sodium of 126, compared to results in care everywhere patient sodium was normal 1 month ago.  He did start hydrochlorothiazide at that time, this may be the cause of his hyponatremia which could be the cause of his near syncopal episodes and continued dizziness that he has here.  His troponin is reassuring  We will discuss with the hospitalist for admission    ____________________________________________   FINAL CLINICAL IMPRESSION(S) / ED DIAGNOSES  Final diagnoses:  Hyponatremia  Near  syncope  Syncope and collapse        Note:  This document was prepared using Dragon voice recognition software and may include unintentional dictation errors.   Lavonia Drafts, MD 12/26/19 1042    Lavonia Drafts, MD 12/26/19 [phone]

## 2019-12-27 ENCOUNTER — Observation Stay: Admit: 2019-12-27 | Payer: Medicare Other

## 2019-12-27 ENCOUNTER — Encounter: Payer: Self-pay | Admitting: Internal Medicine

## 2019-12-27 DIAGNOSIS — R55 Syncope and collapse: Secondary | ICD-10-CM

## 2019-12-27 LAB — CBC
HCT: 35.8 % — ABNORMAL LOW (ref 39.0–52.0)
Hemoglobin: 12.3 g/dL — ABNORMAL LOW (ref 13.0–17.0)
MCH: 32.2 pg (ref 26.0–34.0)
MCHC: 34.4 g/dL (ref 30.0–36.0)
MCV: 93.7 fL (ref 80.0–100.0)
Platelets: 152 10*3/uL (ref 150–400)
RBC: 3.82 MIL/uL — ABNORMAL LOW (ref 4.22–5.81)
RDW: 13 % (ref 11.5–15.5)
WBC: 5.1 10*3/uL (ref 4.0–10.5)
nRBC: 0 % (ref 0.0–0.2)

## 2019-12-27 LAB — BASIC METABOLIC PANEL
Anion gap: 5 (ref 5–15)
Anion gap: 8 (ref 5–15)
BUN: 11 mg/dL (ref 8–23)
BUN: 15 mg/dL (ref 8–23)
CO2: 27 mmol/L (ref 22–32)
CO2: 27 mmol/L (ref 22–32)
Calcium: 8.4 mg/dL — ABNORMAL LOW (ref 8.9–10.3)
Calcium: 8.3 mg/dL — ABNORMAL LOW (ref 8.9–10.3)
Chloride: 100 mmol/L (ref 98–111)
Chloride: 97 mmol/L — ABNORMAL LOW (ref 98–111)
Creatinine, Ser: 0.76 mg/dL (ref 0.61–1.24)
Creatinine, Ser: 0.9 mg/dL (ref 0.61–1.24)
GFR calc Af Amer: >60 mL/min (ref >60)
GFR calc Af Amer: >60 mL/min (ref >60)
GFR calc non Af Amer: >60 mL/min (ref >60)
GFR calc non Af Amer: >60 mL/min (ref >60)
Glucose, Bld: 111 mg/dL — ABNORMAL HIGH (ref 70–99)
Glucose, Bld: 158 mg/dL — ABNORMAL HIGH (ref 70–99)
Potassium: 3.9 mmol/L (ref 3.5–5.1)
Potassium: 4.2 mmol/L (ref 3.5–5.1)
Sodium: 132 mmol/L — ABNORMAL LOW (ref 135–145)
Sodium: 132 mmol/L — ABNORMAL LOW (ref 135–145)

## 2019-12-27 LAB — GLUCOSE, CAPILLARY: Glucose-Capillary: 123 mg/dL — ABNORMAL HIGH (ref 70–99)

## 2019-12-27 NOTE — Progress Notes (Signed)
PROGRESS NOTE    [name]  ZOX:[id number] DOB: [birth year] DOA: 12/26/2019 PCP: Dion Body, MD  Outpatient Specialists:     Brief Narrative:  Vincent Schneider is a 79 y.o. male with medical history significant for BPH, hypertension, anxiety disorder who presents to the ER by EMS for evaluation of a near syncopal episode.  Patient states that he has had 3 episodes, the initial one started early in the hours of the morning when he got up to use the bathroom.  He complained of feeling very dizzy and lightheaded and thinks he may have passed out.  He had nausea but denies having any vomiting, no constipation or diarrhea.  He went back to bed and woke up very diaphoretic and when he attempted to get up he felt very dizzy and lightheaded so he called 911.  He attributes his symptoms to what he had for dinner which she said made him very bloated and uncomfortable. He denies having any chest pain, no shortness of breath, no fever, no chills, no cough or any urinary symptoms. He was recently started on hydrochlorothiazide by his primary care provider for hypertension. Labs show sodium 126, potassium 4.5, chloride 90, bicarb 26, BUN 13, creatinine 0.74, calcium 9.9, magnesium 2.2, alkaline phosphatase 46, albumin 4.1,, ALT 17, troponin 6, white count 10.7, hemoglobin 14.3, hematocrit 40.3, MCV 90.6, RDW 12.6, platelet count 164, TSH 1.47 Chest x-ray reviewed by me shows no obvious infiltrate or effusion Twelve-lead EKG reviewed by me shows sinus rhythm with a first-degree AV block, incomplete right bundle branch block and left anterior fascicular block   Assessment & Plan:   Principal Problem:   Syncope and collapse Active Problems:   Hyponatremia   BPH (benign prostatic hyperplasia)  # Syncope and collapse Most likely vasovagal. Hyponatremia likely at least partly contributory. We will monitor patient closely on telemetry to rule out arrhythmias as the cause of his near syncopal  episode Twelve-lead EKG shows a first-degree AV block he was said to have had a rhythm strip that showed bradycardia in the field. Cardiology consulted  - Obtain 2D echocardiogram to assess LVEF and rule out aortic stenosis - likely outpt holter  # Hyponatremia - 126 on admission, from normal in august at [facility], now 132. Most likely 2/2 recent hctz started a few weeks ago. That has been held - d/c IV fluids - repeat bmp in PM  # BPH -Continue Flomax   DVT prophylaxis: lovenox Code Status: full Family Communication: no family updated today Disposition Plan: home   Consultants:   cardiology  Procedures:  Echo pending   Antimicrobials: none   Subjective: Feeling well. No chest pain or recurrence of dizziness. No abd pain, no fever, no room spinning. No diarrhea or vomiting.  Objective: Vitals:   12/27/19 0530 12/27/19 0600 12/27/19 0630 12/27/19 0700  BP: 136/86 (!) 152/90 (!) 153/95 125/84  Pulse: 62 78 77 68  Resp: 13 14 17 18   Temp:      TempSrc:      SpO2: 99% 99% 97% 96%  Weight:      Height:        Intake/Output Summary (Last 24 hours) at 12/27/2019 0753 Last data filed at 12/27/2019 0631 Gross per 24 hour  Intake 503 ml  Output 975 ml  Net -472 ml   Filed Weights   12/26/19 0845  Weight: 83.9 kg    Examination:  General exam: Appears calm and comfortable  Respiratory system: Clear to auscultation. Respiratory effort  normal. Cardiovascular system: S1 & S2 heard, RRR. No JVD, murmurs, rubs, gallops or clicks. No pedal edema. Gastrointestinal system: Abdomen is nondistended, soft and nontender. No organomegaly or masses felt. Normal bowel sounds heard. Central nervous system: Alert and oriented. No focal neurological deficits. Extremities: Symmetric 5 x 5 power. Skin: No rashes, lesions or ulcers Psychiatry: Judgement and insight appear normal. Mood & affect appropriate.     Data Reviewed: I have personally reviewed following labs and imaging  studies  CBC: Recent Labs  Lab 12/26/19 0836 12/27/19 0358  WBC 10.7* 5.1  HGB 14.3 12.3*  HCT 40.3 35.8*  MCV 90.6 93.7  PLT 164 878   Basic Metabolic Panel: Recent Labs  Lab 12/26/19 0836 12/27/19 0358  NA 126* 132*  K 4.5 3.9  CL 91* 100  CO2 26 27  GLUCOSE 131* 111*  BUN 13 11  CREATININE 0.74 0.76  CALCIUM 8.9 8.4*  MG 2.2  --    GFR: Estimated Creatinine Clearance: 87.4 mL/min (by C-G formula based on SCr of 0.76 mg/dL). Liver Function Tests: Recent Labs  Lab 12/26/19 0836  AST 37  ALT 17  ALKPHOS 46  BILITOT 1.4*  PROT 7.0  ALBUMIN 4.1   No results for input(s): LIPASE, AMYLASE in the last 168 hours. No results for input(s): AMMONIA in the last 168 hours. Coagulation Profile: No results for input(s): INR, PROTIME in the last 168 hours. Cardiac Enzymes: No results for input(s): CKTOTAL, CKMB, CKMBINDEX, TROPONINI in the last 168 hours. BNP (last 3 results) No results for input(s): PROBNP in the last 8760 hours. HbA1C: No results for input(s): HGBA1C in the last 72 hours. CBG: No results for input(s): GLUCAP in the last 168 hours. Lipid Profile: No results for input(s): CHOL, HDL, LDLCALC, TRIG, CHOLHDL, LDLDIRECT in the last 72 hours. Thyroid Function Tests: Recent Labs    12/26/19 0836  TSH 1.470   Anemia Panel: No results for input(s): VITAMINB12, FOLATE, FERRITIN, TIBC, IRON, RETICCTPCT in the last 72 hours. Urine analysis:    Component Value Date/Time   COLORURINE Yellow 06/23/2013 0733   APPEARANCEUR Clear 06/23/2013 0733   LABSPEC 1.012 06/23/2013 0733   PHURINE 7.0 06/23/2013 0733   GLUCOSEU Negative 06/23/2013 0733   HGBUR Negative 06/23/2013 0733   BILIRUBINUR Negative 06/23/2013 0733   KETONESUR Negative 06/23/2013 0733   PROTEINUR Negative 06/23/2013 0733   NITRITE Negative 06/23/2013 0733   LEUKOCYTESUR Negative 06/23/2013 0733   Sepsis Labs: @LABRCNTIP (procalcitonin:4,lacticidven:4)  ) Recent Results (from the past  240 hour(s))  SARS Coronavirus 2 by RT PCR (hospital order, performed in [facility] hospital lab) Nasopharyngeal Nasopharyngeal Swab     Status: None   Collection Time: 12/26/19  8:36 AM   Specimen: Nasopharyngeal Swab  Result Value Ref Range Status   SARS Coronavirus 2 NEGATIVE NEGATIVE Final    Comment: (NOTE) SARS-CoV-2 target nucleic acids are NOT DETECTED.  The SARS-CoV-2 RNA is generally detectable in upper and lower respiratory specimens during the acute phase of infection. The lowest concentration of SARS-CoV-2 viral copies this assay can detect is 250 copies / mL. A negative result does not preclude SARS-CoV-2 infection and should not be used as the sole basis for treatment or other patient management decisions.  A negative result may occur with improper specimen collection / handling, submission of specimen other than nasopharyngeal swab, presence of viral mutation(s) within the areas targeted by this assay, and inadequate number of viral copies (<250 copies / mL). A negative result must be  combined with clinical observations, patient history, and epidemiological information.  Fact Sheet for Patients:   StrictlyIdeas.no  Fact Sheet for Healthcare Providers: BankingDealers.co.za  This test is not yet approved or  cleared by the [place] FDA and has been authorized for detection and/or diagnosis of SARS-CoV-2 by FDA under an Emergency Use Authorization (EUA).  This EUA will remain in effect (meaning this test can be used) for the duration of the COVID-19 declaration under Section 564(b)(1) of the Act, 21 U.S.C. section 360bbb-3(b)(1), unless the authorization is terminated or revoked sooner.  Performed at [facility], [address], [place], [place] [address]          Radiology Studies: DG Pelvis Portable  Result Date: 12/26/2019 CLINICAL DATA:  Fall EXAM: PORTABLE PELVIS 1-2 VIEWS COMPARISON:   None. FINDINGS: There is no evidence of pelvic fracture or diastasis. Moderate colonic stool burden. Pelvic phleboliths. Clips project over the pelvis. Vascular calcifications. Degenerative changes lower lumbar spine. Limited evaluation of the sacrum secondary to overlying stool. IMPRESSION: 1. No acute osseous abnormality within the limitations of this single image. Electronically Signed   By: Valentino Saxon MD   On: 12/26/2019 10:11   DG Chest Port 1 View  Result Date: 12/26/2019 CLINICAL DATA:  Weakness EXAM: PORTABLE CHEST 1 VIEW COMPARISON:  June 23, 2013 FINDINGS: The cardiomediastinal silhouette is unchanged in contour.LEFT upper lung calcified granuloma. No pleural effusion. No pneumothorax. No acute pleuroparenchymal abnormality. Visualized abdomen is unremarkable. Multilevel degenerative changes of the thoracic spine. IMPRESSION: No acute cardiopulmonary abnormality. Electronically Signed   By: Valentino Saxon MD   On: 12/26/2019 09:09        Scheduled Meds: . aspirin EC  81 mg Oral Daily  . enoxaparin (LOVENOX) injection  40 mg Subcutaneous Q24H  . latanoprost  1 drop Both Eyes QHS  . simvastatin  10 mg Oral q1800  . sodium chloride flush  3 mL Intravenous Q12H  . tamsulosin  0.4 mg Oral Daily  . tamsulosin  0.4 mg Oral Daily  . traZODone  150 mg Oral QHS   Continuous Infusions: . sodium chloride 75 mL/hr at 12/27/19 0631     LOS: 0 days    Time spent: 45 min    Desma Maxim, MD [facility]  If 7PM-7AM, please contact night-coverage [email or web address] Password [facility] 12/27/2019, 7:53 AM

## 2019-12-27 NOTE — ED Notes (Signed)
Oxygen removed. Pt provided with items to wash face.

## 2019-12-27 NOTE — Progress Notes (Signed)
Patient Name: Vincent Schneider Date of Encounter: 12/27/2019  Hospital Problem List     Principal Problem:   Syncope and collapse Active Problems:   Hyponatremia   BPH (benign prostatic hyperplasia)    Patient Profile     79 y.o. male with history of near syncope that occurred this morning.  Patient states that he felt bloated after eating dinner.  In the middle the night he felt lightheaded and nauseated.  He had no chest pain.  He had a mechanical fall 6 days ago causing right hip pain.  He denied palpitations or chest pain.  He had a history of a vasovagal response after after negative anal fistulotomy.  He has a history of hyperlipidemia hypertension.  He is on aspirin, tamsulosin as an outpatient.  EKG showed sinus rhythm with no ischemia.  EMS strips showed sinus rhythm with some artifact versus bradycardia in route.  Troponin was normal.  Covid negative.  He was relatively hyponatremic with a sodium of 126.  Renal function normal.  Potassium normal.  Hemoglobin normal.  White count mildly elevated 10.7.  Chest x-ray showed no acute cardiopulmonary disease.    Subjective   Feels great. Anxious to go home.   Inpatient Medications    . aspirin EC  81 mg Oral Daily  . enoxaparin (LOVENOX) injection  40 mg Subcutaneous Q24H  . latanoprost  1 drop Both Eyes QHS  . simvastatin  10 mg Oral q1800  . sodium chloride flush  3 mL Intravenous Q12H  . tamsulosin  0.4 mg Oral Daily  . tamsulosin  0.4 mg Oral Daily  . traZODone  150 mg Oral QHS    Vital Signs    Vitals:   12/27/19 0830 12/27/19 0900 12/27/19 1000 12/27/19 1030  BP: 136/87 133/81 113/72 118/78  Pulse: 62 66 77 74  Resp: 14 (!) 24 17 15   Temp:      TempSrc:      SpO2: 98% 97% 96% 97%  Weight:      Height:        Intake/Output Summary (Last 24 hours) at 12/27/2019 1108 Last data filed at 12/27/2019 0929 Gross per 24 hour  Intake 1003 ml  Output 1275 ml  Net -272 ml   Filed Weights   12/26/19 0845  Weight:  83.9 kg    Physical Exam    GEN: Well nourished, well developed, in no acute distress.  HEENT: normal.  Neck: Supple, no JVD, carotid bruits, or masses. Cardiac: RRR, no murmurs, rubs, or gallops. No clubbing, cyanosis, edema.  Radials/DP/PT 2+ and equal bilaterally.  Respiratory:  Respirations regular and unlabored, clear to auscultation bilaterally. GI: Soft, nontender, nondistended, BS + x 4. MS: no deformity or atrophy. Skin: warm and dry, no rash. Neuro:  Strength and sensation are intact. Psych: Normal affect.  Labs    CBC Recent Labs    12/26/19 0836 12/27/19 0358  WBC 10.7* 5.1  HGB 14.3 12.3*  HCT 40.3 35.8*  MCV 90.6 93.7  PLT 164 027   Basic Metabolic Panel Recent Labs    12/26/19 0836 12/27/19 0358  NA 126* 132*  K 4.5 3.9  CL 91* 100  CO2 26 27  GLUCOSE 131* 111*  BUN 13 11  CREATININE 0.74 0.76  CALCIUM 8.9 8.4*  MG 2.2  --    Liver Function Tests Recent Labs    12/26/19 0836  AST 37  ALT 17  ALKPHOS 46  BILITOT 1.4*  PROT 7.0  ALBUMIN 4.1   No results for input(s): LIPASE, AMYLASE in the last 72 hours. Cardiac Enzymes No results for input(s): CKTOTAL, CKMB, CKMBINDEX, TROPONINI in the last 72 hours. BNP No results for input(s): BNP in the last 72 hours. D-Dimer No results for input(s): DDIMER in the last 72 hours. Hemoglobin A1C No results for input(s): HGBA1C in the last 72 hours. Fasting Lipid Panel No results for input(s): CHOL, HDL, LDLCALC, TRIG, CHOLHDL, LDLDIRECT in the last 72 hours. Thyroid Function Tests Recent Labs    12/26/19 0836  TSH 1.470    Telemetry    79 y.o. male with history of near syncope that occurred this morning.  Patient states that he felt bloated after eating dinner.  In the middle the night he felt lightheaded and nauseated.  He had no chest pain.  He had a mechanical fall 6 days ago causing right hip pain.  He denied palpitations or chest pain.  He had a history of a vasovagal response after after  negative anal fistulotomy.  He has a history of hyperlipidemia hypertension.  He is on aspirin, tamsulosin as an outpatient.  EKG showed sinus rhythm with no ischemia.  EMS strips showed sinus rhythm with some artifact versus bradycardia in route.  Troponin was normal.  Covid negative.  He was relatively hyponatremic with a sodium of 126.  Renal function normal.  Potassium normal.  Hemoglobin normal.  White count mildly elevated 10.7.  Chest x-ray showed no acute cardiopulmonary disease.  Medically stable with blood pressure 818 systolic.  ECG    No further symptoms  Radiology    DG Pelvis Portable  Result Date: 12/26/2019 CLINICAL DATA:  Fall EXAM: PORTABLE PELVIS 1-2 VIEWS COMPARISON:  None. FINDINGS: There is no evidence of pelvic fracture or diastasis. Moderate colonic stool burden. Pelvic phleboliths. Clips project over the pelvis. Vascular calcifications. Degenerative changes lower lumbar spine. Limited evaluation of the sacrum secondary to overlying stool. IMPRESSION: 1. No acute osseous abnormality within the limitations of this single image. Electronically Signed   By: Valentino Saxon MD   On: 12/26/2019 10:11   DG Chest Port 1 View  Result Date: 12/26/2019 CLINICAL DATA:  Weakness EXAM: PORTABLE CHEST 1 VIEW COMPARISON:  June 23, 2013 FINDINGS: The cardiomediastinal silhouette is unchanged in contour.LEFT upper lung calcified granuloma. No pleural effusion. No pneumothorax. No acute pleuroparenchymal abnormality. Visualized abdomen is unremarkable. Multilevel degenerative changes of the thoracic spine. IMPRESSION: No acute cardiopulmonary abnormality. Electronically Signed   By: Valentino Saxon MD   On: 12/26/2019 09:09    Assessment & Plan    79 year old male with prior cardiac history as well as had syncope during vasovagal episodes in the past who was admitted after feeling some gastric stress nausea since last p.m.  Felt lightheaded this morning and called EMS.  In route  patient got bradycardic with abdominal cramping.  He is ruled out for myocardial infarction with a negative serum troponin.  Renal function was normal although was moderately hyponatremic with a sodium of 126 which has improved to 132.  He is currently hemodynamically stable.  1.  Near syncope-appears likely vasovagal although hyponatremia may be playing a role somewhat.  Is hemodynamically stable.  Will place him back on his tamsulosin at 0.4 mg daily and follow his heart rate, blood pressure.  Will do echo and holter as outpatient. Stable at present. Appears to have had vaso vagal event. OK for discharge from cardiac standpoint. Will cancel inpatient echo. Follow up with my office  next week.   2.  Hyponatremia-etiology unclear.Improved overnight  Signed, Javier Docker. Lynell Greenhouse MD 12/27/2019, 11:08 AM  Pager: [phone]

## 2019-12-27 NOTE — ED Notes (Signed)
Pt placed on 1L [place] due to oxygen saturations dropping while sleeping.

## 2019-12-27 NOTE — Discharge Instructions (Signed)
Hyponatremia Hyponatremia is when the amount of salt (sodium) in your blood is too low. When salt levels are low, your body may take in extra water. This can cause swelling throughout the body. The swelling often affects the brain. What are the causes? This condition may be caused by:  Certain medical problems or conditions.  Vomiting a lot.  Having watery poop (diarrhea) often.  Certain medicines or illegal drugs.  Not having enough water in the body (dehydration).  Drinking too much water.  Eating a diet that is low in salt.  Large burns on your body.  Too much sweating. What increases the risk? You are more likely to get this condition if you:  Have long-term (chronic) kidney disease.  Have heart failure.  Have a medical condition that causes you to have watery poop often.  Do very hard exercises.  Take medicines that affect the amount of salt is in your blood. What are the signs or symptoms? Symptoms of this condition include:  Headache.  Feeling like you may vomit (nausea).  Vomiting.  Being very tired (lethargic).  Muscle weakness and cramps.  Not wanting to eat as much as normal (loss of appetite).  Feeling weak or light-headed. Severe symptoms of this condition include:  Confusion.  Feeling restless (agitation).  Having a fast heart rate.  Passing out (fainting).  Seizures.  Coma. How is this treated? Treatment for this condition depends on the cause. Treatment may include:  Getting fluids through an IV tube that is put into one of your veins.  Taking medicines to fix the salt levels in your blood. If medicines are causing the problem, your medicines will need to be changed.  Limiting how much water or fluid you take in.  Monitoring in the hospital to watch your symptoms. Follow these instructions at home:   Take over-the-counter and prescription medicines only as told by your doctor. Many medicines can make this condition worse.  Talk with your doctor about any medicines that you are taking.  Eat and drink exactly as you are told by your doctor. ? Eat only the foods you are told to eat. ? Limit how much fluid you take.  Do not drink alcohol.  Keep all follow-up visits as told by your doctor. This is important. Contact a doctor if:  You feel more like you may vomit.  You feel more tired.  Your headache gets worse.  You feel more confused.  You feel weaker.  Your symptoms go away and then they come back.  You have trouble following the diet instructions. Get help right away if:  You have a seizure.  You pass out.  You keep having watery poop.  You keep vomiting. Summary  Hyponatremia is when the amount of salt in your blood is too low.  When salt levels are low, you can have swelling throughout the body. The swelling mostly affects the brain.  Treatment depends on the cause. Treatment may include getting IV fluids, medicines, or not drinking as much fluid. This information is not intended to replace advice given to you by your health care provider. Make sure you discuss any questions you have with your health care provider. Document Revised: 06/12/2018 Document Reviewed: 02/27/2018 Elsevier Patient Education  2020 [employer]  

## 2019-12-27 NOTE — Discharge Summary (Signed)
Vincent Schneider SMO:[id number] DOB: Apr 18, 1940 DOA: 12/26/2019  PCP: Dion Body, MD  Admit date: 12/26/2019 Discharge date: 12/27/2019  Time spent: 25 minutes  Recommendations for Outpatient Follow-up:  1. Cardiology follow-up 2. Repeat sodium level within 1 week   Discharge Diagnoses:  Principal Problem:   Syncope and collapse Active Problems:   Hyponatremia   BPH (benign prostatic hyperplasia)   Discharge Condition: food  Diet recommendation: regualr  Filed Weights   12/26/19 0845  Weight: 83.9 kg    History of present illness:  Vincent Schneider a 79 y.o.malewith medical history significant forBPH, hypertension, anxiety disorder who presents to the ER by EMS for evaluation of a near syncopal episode. Patient states that he has had 3 episodes, the initial one started early in thehours of the morning when he got up to use the bathroom. He complained of feeling very dizzy and lightheaded and thinks he may have passed out.He had nausea but denies having any vomiting, no constipation ordiarrhea.He went back to bed and woke up very diaphoretic and when he attempted to get up he felt very dizzy and lightheaded so he called 911. He attributes his symptoms to what he had for dinner which she said made him very bloated and uncomfortable. He denies having any chest pain, no shortness of breath, no fever, no chills, no cough or any urinary symptoms. He was recently started on hydrochlorothiazide by his primary care provider for hypertension. Labs show sodium 126, potassium 4.5, chloride 90, bicarb 26, BUN 13, creatinine 0.74, calcium 9.9, magnesium 2.2, alkaline phosphatase 46, albumin 4.1,, ALT 17, troponin6,white count 10.7, hemoglobin 14.3, hematocrit 40.3, MCV 90.6, RDW 12.6, platelet count 164, TSH 1.47 Chest x-ray reviewed by me shows no obvious infiltrate or effusion Twelve-lead EKG reviewed by me shows sinus rhythm with a first-degree AV block, incomplete right  bundle branch block and left anterior fascicular block  Hospital Course:  # Near syncope Most likely vasovagal. Hyponatremia likely at least partly contributory. No sig findings on tele/ecg.  - seen by cardiology advises outpt TTE and likely cardiac monitor, will f/u w/ Dr. Ubaldo Glassing in 1 week  # Hyponatremia - 126 on admission, from normal in august at [place], up to 133 corrected day of discharge. Most likely 2/2 recent hctz started a few weeks ago. That has been held - f/u pcp within 1 week for repeat sodium level    Procedures:  none  Consultations:  cardiology  Discharge Exam: Vitals:   12/27/19 1330 12/27/19 1406  BP: (!) 141/94 (!) 135/91  Pulse: 81 71  Resp: 18 16  Temp:  98.5 F (36.9 C)  SpO2: 95% 100%    General exam: Appears calm and comfortable  Respiratory system: Clear to auscultation. Respiratory effort normal. Cardiovascular system: S1 & S2 heard, RRR. No JVD, murmurs, rubs, gallops or clicks. No pedal edema. Gastrointestinal system: Abdomen is nondistended, soft and nontender. No organomegaly or masses felt. Normal bowel sounds heard. Central nervous system: Alert and oriented. No focal neurological deficits. Extremities: Symmetric 5 x 5 power. Skin: No rashes, lesions or ulcers Psychiatry: Judgement and insight appear normal. Mood & affect appropriate.   Discharge Instructions   Discharge Instructions    Call MD for:  difficulty breathing, headache or visual disturbances   Complete by: As directed    Call MD for:  extreme fatigue   Complete by: As directed    Call MD for:  persistant dizziness or light-headedness   Complete by: As directed  Call MD for:  persistant nausea and vomiting   Complete by: As directed    Call MD for:  redness, tenderness, or signs of infection (pain, swelling, redness, odor or green/yellow discharge around incision site)   Complete by: As directed    Call MD for:  temperature >100.4   Complete by: As directed    Diet -  low sodium heart healthy   Complete by: As directed    Increase activity slowly   Complete by: As directed    No wound care   Complete by: As directed      Allergies as of 12/27/2019   No Known Allergies     Medication List    STOP taking these medications   hydrochlorothiazide 12.5 MG tablet Commonly known as: HYDRODIURIL     TAKE these medications   aspirin EC 81 MG tablet Take 81 mg by mouth daily.   latanoprost 0.005 % ophthalmic solution Commonly known as: XALATAN Place 1 drop into both eyes at bedtime.   simvastatin 10 MG tablet Commonly known as: ZOCOR Take 10 mg by mouth daily at 6 PM.   tamsulosin 0.4 MG Caps capsule Commonly known as: FLOMAX Take 0.4 mg by mouth daily.   traZODone 150 MG tablet Commonly known as: DESYREL Take 150 mg by mouth at bedtime.      No Known Allergies  Follow-up Information    Teodoro Spray, MD Follow up in 1 week(s).   Specialty: Cardiology Contact information: [place] [address] [phone]                The results of significant diagnostics from this hospitalization (including imaging, microbiology, ancillary and laboratory) are listed below for reference.    Significant Diagnostic Studies: DG Pelvis Portable  Result Date: 12/26/2019 CLINICAL DATA:  Fall EXAM: PORTABLE PELVIS 1-2 VIEWS COMPARISON:  None. FINDINGS: There is no evidence of pelvic fracture or diastasis. Moderate colonic stool burden. Pelvic phleboliths. Clips project over the pelvis. Vascular calcifications. Degenerative changes lower lumbar spine. Limited evaluation of the sacrum secondary to overlying stool. IMPRESSION: 1. No acute osseous abnormality within the limitations of this single image. Electronically Signed   By: Valentino Saxon MD   On: 12/26/2019 10:11   DG Chest Port 1 View  Result Date: 12/26/2019 CLINICAL DATA:  Weakness EXAM: PORTABLE CHEST 1 VIEW COMPARISON:  June 23, 2013 FINDINGS: The  cardiomediastinal silhouette is unchanged in contour.LEFT upper lung calcified granuloma. No pleural effusion. No pneumothorax. No acute pleuroparenchymal abnormality. Visualized abdomen is unremarkable. Multilevel degenerative changes of the thoracic spine. IMPRESSION: No acute cardiopulmonary abnormality. Electronically Signed   By: Valentino Saxon MD   On: 12/26/2019 09:09    Microbiology: Recent Results (from the past 240 hour(s))  SARS Coronavirus 2 by RT PCR (hospital order, performed in [facility] hospital lab) Nasopharyngeal Nasopharyngeal Swab     Status: None   Collection Time: 12/26/19  8:36 AM   Specimen: Nasopharyngeal Swab  Result Value Ref Range Status   SARS Coronavirus 2 NEGATIVE NEGATIVE Final    Comment: (NOTE) SARS-CoV-2 target nucleic acids are NOT DETECTED.  The SARS-CoV-2 RNA is generally detectable in upper and lower respiratory specimens during the acute phase of infection. The lowest concentration of SARS-CoV-2 viral copies this assay can detect is 250 copies / mL. A negative result does not preclude SARS-CoV-2 infection and should not be used as the sole basis for treatment or other patient management decisions.  A negative  result may occur with improper specimen collection / handling, submission of specimen other than nasopharyngeal swab, presence of viral mutation(s) within the areas targeted by this assay, and inadequate number of viral copies (<250 copies / mL). A negative result must be combined with clinical observations, patient history, and epidemiological information.  Fact Sheet for Patients:   StrictlyIdeas.no  Fact Sheet for Healthcare Providers: BankingDealers.co.za  This test is not yet approved or  cleared by the [place] FDA and has been authorized for detection and/or diagnosis of SARS-CoV-2 by FDA under an Emergency Use Authorization (EUA).  This EUA will remain in effect (meaning  this test can be used) for the duration of the COVID-19 declaration under Section 564(b)(1) of the Act, 21 U.S.C. section 360bbb-3(b)(1), unless the authorization is terminated or revoked sooner.  Performed at [facility], [address] [place], [place] [address]      Labs: Basic Metabolic Panel: Recent Labs  Lab 12/26/19 0836 12/27/19 0358 12/27/19 1417  NA 126* 132* 132*  K 4.5 3.9 4.2  CL 91* 100 97*  CO2 26 27 27   GLUCOSE 131* 111* 158*  BUN 13 11 15   CREATININE 0.74 0.76 0.90  CALCIUM 8.9 8.4* 8.3*  MG 2.2  --   --    Liver Function Tests: Recent Labs  Lab 12/26/19 0836  AST 37  ALT 17  ALKPHOS 46  BILITOT 1.4*  PROT 7.0  ALBUMIN 4.1   No results for input(s): LIPASE, AMYLASE in the last 168 hours. No results for input(s): AMMONIA in the last 168 hours. CBC: Recent Labs  Lab 12/26/19 0836 12/27/19 0358  WBC 10.7* 5.1  HGB 14.3 12.3*  HCT 40.3 35.8*  MCV 90.6 93.7  PLT 164 152   Cardiac Enzymes: No results for input(s): CKTOTAL, CKMB, CKMBINDEX, TROPONINI in the last 168 hours. BNP: BNP (last 3 results) No results for input(s): BNP in the last 8760 hours.  ProBNP (last 3 results) No results for input(s): PROBNP in the last 8760 hours.  CBG: Recent Labs  Lab 12/27/19 0936  GLUCAP 123*       Signed:  Desma Maxim MD.  [facility] 12/27/2019, 3:30 PM

## 2019-12-27 NOTE — Progress Notes (Signed)
Patient discharging home. Daughter at bedside. Instructions given to patient, verbalized understanding. IV removed. Daughter to transport patient home.

## 2019-12-27 NOTE — ED Notes (Signed)
Pt ate 100% of breakfast tray. Pt ambulated to bathroom.

## 2019-12-27 NOTE — Plan of Care (Signed)

## 2020-02-04 ENCOUNTER — Encounter: Payer: Self-pay | Admitting: Dermatology

## 2020-02-04 ENCOUNTER — Other Ambulatory Visit: Payer: Self-pay

## 2020-02-04 ENCOUNTER — Ambulatory Visit (INDEPENDENT_AMBULATORY_CARE_PROVIDER_SITE_OTHER): Payer: Medicare Other | Admitting: Dermatology

## 2020-02-04 DIAGNOSIS — L578 Other skin changes due to chronic exposure to nonionizing radiation: Secondary | ICD-10-CM

## 2020-02-04 DIAGNOSIS — Z1283 Encounter for screening for malignant neoplasm of skin: Secondary | ICD-10-CM

## 2020-02-04 DIAGNOSIS — L821 Other seborrheic keratosis: Secondary | ICD-10-CM

## 2020-02-04 DIAGNOSIS — Z85828 Personal history of other malignant neoplasm of skin: Secondary | ICD-10-CM

## 2020-02-04 DIAGNOSIS — L570 Actinic keratosis: Secondary | ICD-10-CM

## 2020-02-04 DIAGNOSIS — I872 Venous insufficiency (chronic) (peripheral): Secondary | ICD-10-CM | POA: Diagnosis not present

## 2020-02-04 DIAGNOSIS — D229 Melanocytic nevi, unspecified: Secondary | ICD-10-CM

## 2020-02-04 DIAGNOSIS — L814 Other melanin hyperpigmentation: Secondary | ICD-10-CM | POA: Diagnosis not present

## 2020-02-04 DIAGNOSIS — D1800 Hemangioma unspecified site: Secondary | ICD-10-CM

## 2020-02-04 NOTE — Progress Notes (Signed)
   Follow-Up Visit   Subjective  Vincent Schneider is a 79 y.o. male who presents for the following: Annual Exam (Hx BCC ). Lesion on the L cheek that flakes every three weeks.  The patient presents for Total-Body Skin Exam (TBSE) for skin cancer screening and mole check.  The following portions of the chart were reviewed this encounter and updated as appropriate:  Tobacco  Allergies  Meds  Problems  Med Hx  Surg Hx  Fam Hx     Review of Systems:  No other skin or systemic complaints except as noted in HPI or Assessment and Plan.  Objective  Well appearing patient in no apparent distress; mood and affect are within normal limits.  A full examination was performed including scalp, head, eyes, ears, nose, lips, neck, chest, axillae, abdomen, back, buttocks, bilateral upper extremities, bilateral lower extremities, hands, feet, fingers, toes, fingernails, and toenails. All findings within normal limits unless otherwise noted below.  Objective  L cheek: Erythematous thin papules/macules with gritty scale.   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    Lentigines - Scattered tan macules - Discussed due to sun exposure - Benign, observe - Call for any changes  Seborrheic Keratoses - Stuck-on, waxy, tan-brown papules and plaques  - Discussed benign etiology and prognosis. -  Observe - Call for any changes  Melanocytic Nevi - Tan-brown and/or pink-flesh-colored symmetric macules and papules - Benign appearing on exam today - Observation - Call clinic for new or changing moles - Recommend daily use of broad spectrum spf 30+ sunscreen to sun-exposed areas.   Hemangiomas - Red papules - Discussed benign nature - Observe - Call for any changes  Actinic Damage - diffuse scaly erythematous macules with underlying dyspigmentation - Recommend daily broad spectrum sunscreen SPF 30+ to sun-exposed areas, reapply every 2 hours as needed.  - Call for new or changing lesions.  History of Basal Cell Carcinoma of the Skin - No evidence of recurrence today - Recommend regular full body skin exams - Recommend daily broad spectrum sunscreen SPF 30+ to sun-exposed areas, reapply every 2 hours as needed.  - Call if any new or changing lesions are noted between office visits  Skin cancer screening performed today.  Return in about 1 year (around 02/03/2021) for TBSE.  Luther Redo, CMA, am acting as scribe for Sarina Ser, MD .  Documentation: I have reviewed the above documentation for accuracy and completeness, and I agree with the above.  Sarina Ser, MD

## 2021-02-02 ENCOUNTER — Ambulatory Visit (INDEPENDENT_AMBULATORY_CARE_PROVIDER_SITE_OTHER): Payer: Medicare Other | Admitting: Dermatology

## 2021-02-02 ENCOUNTER — Other Ambulatory Visit: Payer: Self-pay

## 2021-02-02 DIAGNOSIS — L814 Other melanin hyperpigmentation: Secondary | ICD-10-CM

## 2021-02-02 DIAGNOSIS — Z85828 Personal history of other malignant neoplasm of skin: Secondary | ICD-10-CM | POA: Diagnosis not present

## 2021-02-02 DIAGNOSIS — D1800 Hemangioma unspecified site: Secondary | ICD-10-CM

## 2021-02-02 DIAGNOSIS — L821 Other seborrheic keratosis: Secondary | ICD-10-CM

## 2021-02-02 DIAGNOSIS — D692 Other nonthrombocytopenic purpura: Secondary | ICD-10-CM

## 2021-02-02 DIAGNOSIS — L570 Actinic keratosis: Secondary | ICD-10-CM | POA: Diagnosis not present

## 2021-02-02 DIAGNOSIS — D229 Melanocytic nevi, unspecified: Secondary | ICD-10-CM

## 2021-02-02 DIAGNOSIS — Z872 Personal history of diseases of the skin and subcutaneous tissue: Secondary | ICD-10-CM

## 2021-02-02 DIAGNOSIS — Z1283 Encounter for screening for malignant neoplasm of skin: Secondary | ICD-10-CM | POA: Diagnosis not present

## 2021-02-02 DIAGNOSIS — L578 Other skin changes due to chronic exposure to nonionizing radiation: Secondary | ICD-10-CM | POA: Diagnosis not present

## 2021-02-02 NOTE — Progress Notes (Signed)
Follow-Up Visit   Subjective  Vincent Schneider is a 80 y.o. male who presents for the following: Total body skin exam (Hx of BCC forehead, hx of Aks). The patient presents for Total-Body Skin Exam (TBSE) for skin cancer screening and mole check.  The following portions of the chart were reviewed this encounter and updated as appropriate:   Tobacco  Allergies  Meds  Problems  Med Hx  Surg Hx  Fam Hx     Review of Systems:  No other skin or systemic complaints except as noted in HPI or Assessment and Plan.  Objective  Well appearing patient in no apparent distress; mood and affect are within normal limits.  A full examination was performed including scalp, head, eyes, ears, nose, lips, neck, chest, axillae, abdomen, back, buttocks, bilateral upper extremities, bilateral lower extremities, hands, feet, fingers, toes, fingernails, and toenails. All findings within normal limits unless otherwise noted below.  forehead Well healed scar with no evidence of recurrence.   L cheek x 1 Pink scaly macules    Assessment & Plan  History of basal cell carcinoma (BCC) forehead  Clear. Observe for recurrence. Call clinic for new or changing lesions.  Recommend regular skin exams, daily broad-spectrum spf 30+ sunscreen use, and photoprotection.    AK (actinic keratosis) L cheek x 1  Destruction of lesion - L cheek x 1 Complexity: simple   Destruction method: cryotherapy   Informed consent: discussed and consent obtained   Timeout:  patient name, date of birth, surgical site, and procedure verified Lesion destroyed using liquid nitrogen: Yes   Region frozen until ice ball extended beyond lesion: Yes   Outcome: patient tolerated procedure well with no complications   Post-procedure details: wound care instructions given    Skin cancer screening  Lentigines - Scattered tan macules - Due to sun exposure - Benign-appearing, observe - Recommend daily broad spectrum sunscreen SPF 30+  to sun-exposed areas, reapply every 2 hours as needed. - Call for any changes  Seborrheic Keratoses - Stuck-on, waxy, tan-brown papules and/or plaques  - Benign-appearing - Discussed benign etiology and prognosis. - Observe - Call for any changes  Melanocytic Nevi - Tan-brown and/or pink-flesh-colored symmetric macules and papules - Benign appearing on exam today - Observation - Call clinic for new or changing moles - Recommend daily use of broad spectrum spf 30+ sunscreen to sun-exposed areas.   Hemangiomas - Red papules - Discussed benign nature - Observe - Call for any changes  Actinic Damage - Chronic condition, secondary to cumulative UV/sun exposure - diffuse scaly erythematous macules with underlying dyspigmentation - Recommend daily broad spectrum sunscreen SPF 30+ to sun-exposed areas, reapply every 2 hours as needed.  - Staying in the shade or wearing long sleeves, sun glasses (UVA+UVB protection) and wide brim hats (4-inch brim around the entire circumference of the hat) are also recommended for sun protection.  - Call for new or changing lesions.  Skin cancer screening performed today.  Purpura - Chronic; persistent and recurrent.  Treatable, but not curable. - Violaceous macules and patches - Benign - Related to trauma, age, sun damage and/or use of blood thinners, chronic use of topical and/or oral steroids - Observe - Can use OTC arnica containing moisturizer such as Dermend Bruise Formula if desired - Call for worsening or other concerns  Return in about 1 year (around 02/02/2022) for TBSE, Hx of BCC, Hx of AKs.  I, Othelia Pulling, RMA, am acting as scribe for Sarina Ser, MD .  Documentation: I have reviewed the above documentation for accuracy and completeness, and I agree with the above.  Sarina Ser, MD

## 2021-02-02 NOTE — Patient Instructions (Signed)

## 2021-02-05 ENCOUNTER — Encounter: Payer: Self-pay | Admitting: Dermatology

## 2021-11-08 IMAGING — DX DG CHEST 1V PORT
1 series · 2 of 2 positions shown · non-contrast
Comparison: June 23, 2013

CLINICAL DATA: Weakness

EXAM:
PORTABLE CHEST 1 VIEW

[Series 1: chest ap · 0.14mm/px · 2 of 2 slices shown]
[im 1/2]
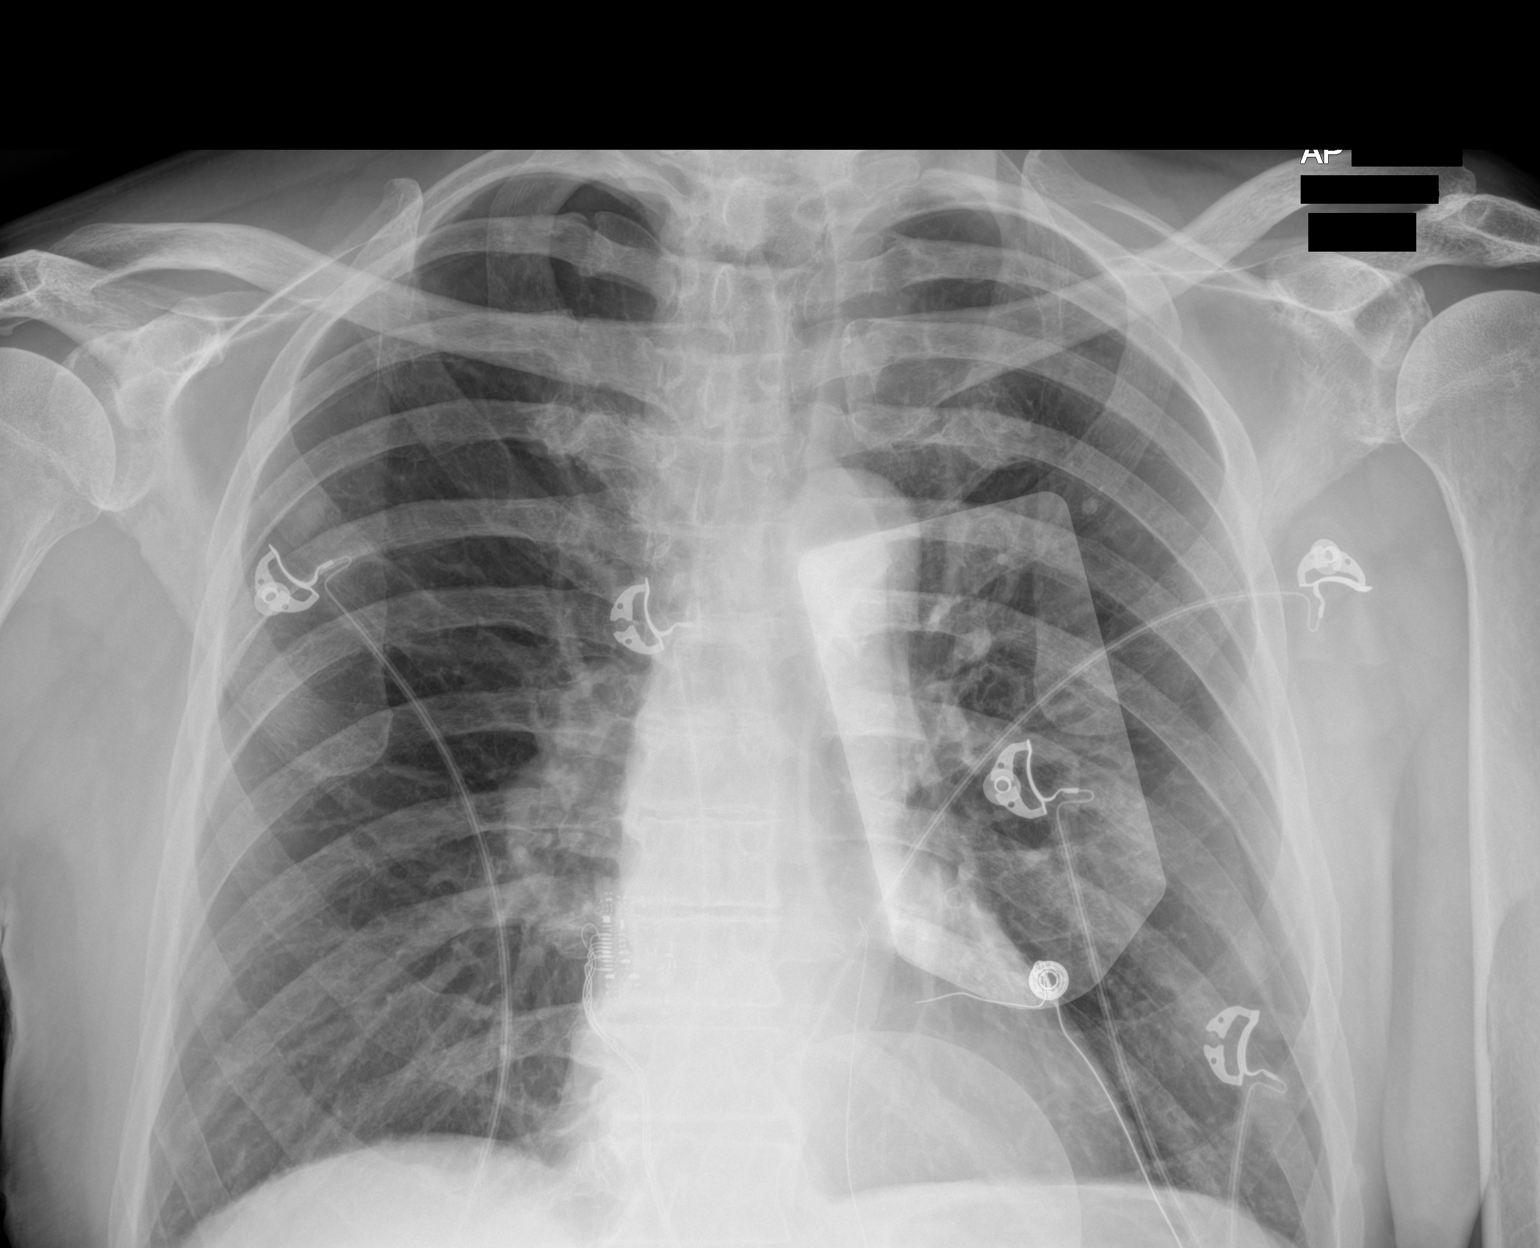
[im 2/2]
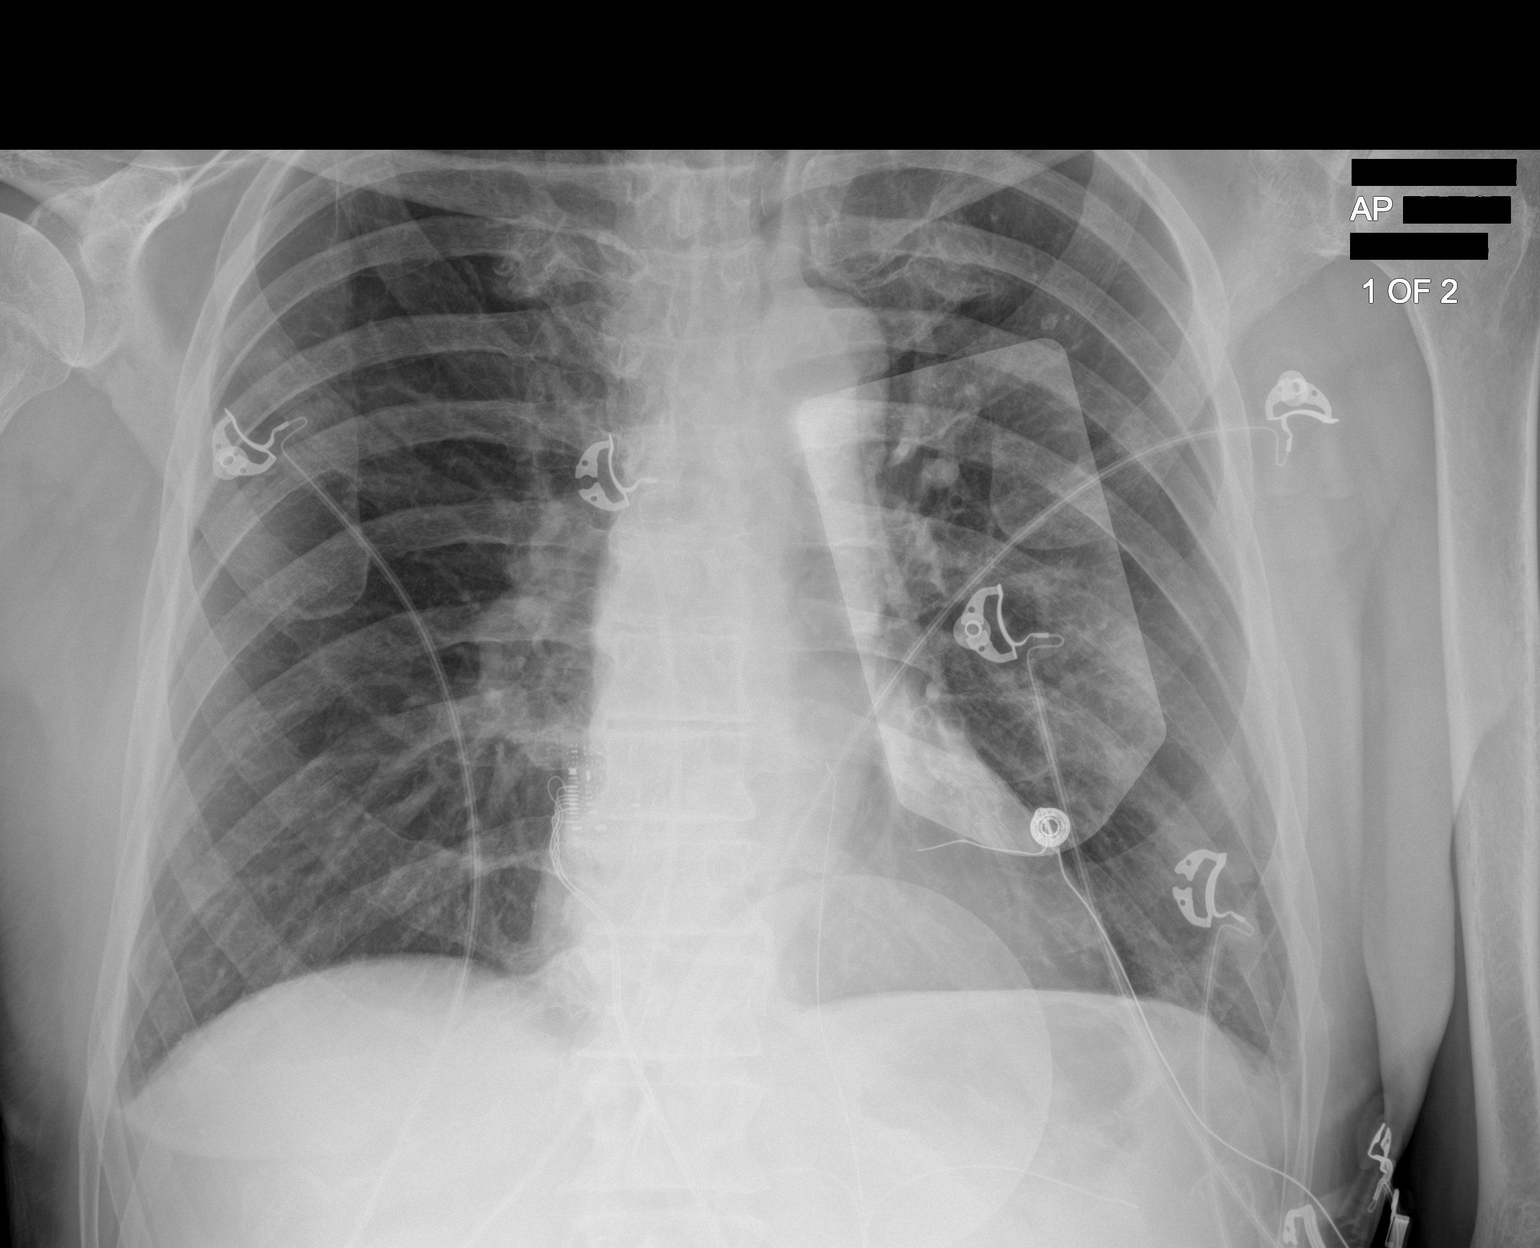

[2 of 2 positions shown; findings below may reference images not displayed]

FINDINGS: The cardiomediastinal silhouette is unchanged in contour.LEFT upper
lung calcified granuloma. No pleural effusion. No pneumothorax. No
acute pleuroparenchymal abnormality. Visualized abdomen is
unremarkable. Multilevel degenerative changes of the thoracic spine.
IMPRESSION: No acute cardiopulmonary abnormality.

## 2022-02-07 ENCOUNTER — Ambulatory Visit (INDEPENDENT_AMBULATORY_CARE_PROVIDER_SITE_OTHER): Payer: Medicare Other | Admitting: Dermatology

## 2022-02-07 DIAGNOSIS — D692 Other nonthrombocytopenic purpura: Secondary | ICD-10-CM

## 2022-02-07 DIAGNOSIS — Z1283 Encounter for screening for malignant neoplasm of skin: Secondary | ICD-10-CM

## 2022-02-07 DIAGNOSIS — L570 Actinic keratosis: Secondary | ICD-10-CM

## 2022-02-07 DIAGNOSIS — L603 Nail dystrophy: Secondary | ICD-10-CM

## 2022-02-07 DIAGNOSIS — L578 Other skin changes due to chronic exposure to nonionizing radiation: Secondary | ICD-10-CM

## 2022-02-07 DIAGNOSIS — L814 Other melanin hyperpigmentation: Secondary | ICD-10-CM

## 2022-02-07 DIAGNOSIS — Z85828 Personal history of other malignant neoplasm of skin: Secondary | ICD-10-CM | POA: Diagnosis not present

## 2022-02-07 DIAGNOSIS — D229 Melanocytic nevi, unspecified: Secondary | ICD-10-CM

## 2022-02-07 DIAGNOSIS — L821 Other seborrheic keratosis: Secondary | ICD-10-CM

## 2022-02-07 NOTE — Progress Notes (Signed)
Follow-Up Visit   Subjective  Vincent Schneider is a 81 y.o. male who presents for the following: Annual Exam (Tbse. Hx of bcc, hx of aks,. Reports spots at right side of neck, spot under right thumbnail and spot at right temple  ). The patient presents for Total-Body Skin Exam (TBSE) for skin cancer screening and mole check.  The patient has spots, moles and lesions to be evaluated, some may be new or changing and the patient has concerns that these could be cancer.  The following portions of the chart were reviewed this encounter and updated as appropriate:  Tobacco  Allergies  Meds  Problems  Med Hx  Surg Hx  Fam Hx     Review of Systems: No other skin or systemic complaints except as noted in HPI or Assessment and Plan.  Objective  Well appearing patient in no apparent distress; mood and affect are within normal limits.  A full examination was performed including scalp, head, eyes, ears, nose, lips, neck, chest, axillae, abdomen, back, buttocks, bilateral upper extremities, bilateral lower extremities, hands, feet, fingers, toes, fingernails, and toenails. All findings within normal limits unless otherwise noted below.  Right Thumb Nail Plate Dystrophy of right thumbnail       right forehead x 1 Erythematous thin papules/macules with gritty scale.    Assessment & Plan  Nail dystrophy Most likely trauma related with subungual blood. Right Thumb Nail Plate See photo  Will continue to monitor. Will recheck at next follow up Photos today  If doesn't go away could consider bx   Actinic keratosis right forehead x 1 Actinic keratoses are precancerous spots that appear secondary to cumulative UV radiation exposure/sun exposure over time. They are chronic with expected duration over 1 year. A portion of actinic keratoses will progress to squamous cell carcinoma of the skin. It is not possible to reliably predict which spots will progress to skin cancer and so treatment is  recommended to prevent development of skin cancer.  Recommend daily broad spectrum sunscreen SPF 30+ to sun-exposed areas, reapply every 2 hours as needed.  Recommend staying in the shade or wearing long sleeves, sun glasses (UVA+UVB protection) and wide brim hats (4-inch brim around the entire circumference of the hat). Call for new or changing lesions.  Destruction of lesion - right forehead x 1 Complexity: extensive   Destruction method: electrodesiccation and curettage   Informed consent: discussed and consent obtained   Timeout:  patient name, date of birth, surgical site, and procedure verified Procedure prep:  Patient was prepped and draped in usual sterile fashion Prep type:  Isopropyl alcohol Anesthesia: the lesion was anesthetized in a standard fashion   Anesthetic:  1% lidocaine w/ epinephrine 1-100,000 buffered w/ 8.4% NaHCO3 Curettage performed in three different directions: Yes   Electrodesiccation performed over the curetted area: Yes   Hemostasis achieved with:  pressure, aluminum chloride and electrodesiccation Outcome: patient tolerated procedure well with no complications   Post-procedure details: sterile dressing applied and wound care instructions given   Dressing type: bandage and petrolatum   Additional details:  Prior to procedure, discussed risks of blister formation, small wound, skin dyspigmentation, or rare scar following cryotherapy. Recommend Vaseline ointment to treated areas while healing.  Lentigines - Scattered tan macules - Due to sun exposure - Benign-appearing, observe - Recommend daily broad spectrum sunscreen SPF 30+ to sun-exposed areas, reapply every 2 hours as needed. - Call for any changes  Seborrheic Keratoses At neck - Stuck-on, waxy, tan-brown  papules and/or plaques  - Benign-appearing - Discussed benign etiology and prognosis. - Observe - Call for any changes  Purpura - Chronic; persistent and recurrent.  Treatable, but not  curable. - Violaceous macules and patches - Benign - Related to trauma, age, sun damage and/or use of blood thinners, chronic use of topical and/or oral steroids - Observe - Can use OTC arnica containing moisturizer such as Dermend Bruise Formula if desired - Call for worsening or other concerns  Melanocytic Nevi - Tan-brown and/or pink-flesh-colored symmetric macules and papules - Benign appearing on exam today - Observation - Call clinic for new or changing moles - Recommend daily use of broad spectrum spf 30+ sunscreen to sun-exposed areas.   Hemangiomas - Red papules - Discussed benign nature - Observe - Call for any changes  Actinic Damage - Chronic condition, secondary to cumulative UV/sun exposure - diffuse scaly erythematous macules with underlying dyspigmentation - Recommend daily broad spectrum sunscreen SPF 30+ to sun-exposed areas, reapply every 2 hours as needed.  - Staying in the shade or wearing long sleeves, sun glasses (UVA+UVB protection) and wide brim hats (4-inch brim around the entire circumference of the hat) are also recommended for sun protection.  - Call for new or changing lesions.  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  - Call if any new or changing lesions are noted between office visits  Skin cancer screening performed today. Return in about 1 year (around 02/08/2023) for TBSE. IRuthell Rummage, CMA, am acting as scribe for Sarina Ser, MD. Documentation: I have reviewed the above documentation for accuracy and completeness, and I agree with the above.  Sarina Ser, MD

## 2022-02-07 NOTE — Patient Instructions (Signed)
     Melanoma ABCDEs  Melanoma is the most dangerous type of skin cancer, and is the leading cause of death from skin disease.  You are more likely to develop melanoma if you: Have light-colored skin, light-colored eyes, or red or blond hair Spend a lot of time in the sun Tan regularly, either outdoors or in a tanning bed Have had blistering sunburns, especially during childhood Have a close family member who has had a melanoma Have atypical moles or large birthmarks  Early detection of melanoma is key since treatment is typically straightforward and cure rates are extremely high if we catch it early.   The first sign of melanoma is often a change in a mole or a new dark spot.  The ABCDE system is a way of remembering the signs of melanoma.  A for asymmetry:  The two halves do not match. B for border:  The edges of the growth are irregular. C for color:  A mixture of colors are present instead of an even brown color. D for diameter:  Melanomas are usually (but not always) greater than 6mm - the size of a pencil eraser. E for evolution:  The spot keeps changing in size, shape, and color.  Please check your skin once per month between visits. You can use a small mirror in front and a large mirror behind you to keep an eye on the back side or your body.   If you see any new or changing lesions before your next follow-up, please call to schedule a visit.  Please continue daily skin protection including broad spectrum sunscreen SPF 30+ to sun-exposed areas, reapplying every 2 hours as needed when you're outdoors.   Staying in the shade or wearing long sleeves, sun glasses (UVA+UVB protection) and wide brim hats (4-inch brim around the entire circumference of the hat) are also recommended for sun protection.    Due to recent changes in healthcare laws, you may see results of your pathology and/or laboratory studies on MyChart before the doctors have had a chance to review them. We  understand that in some cases there may be results that are confusing or concerning to you. Please understand that not all results are received at the same time and often the doctors may need to interpret multiple results in order to provide you with the best plan of care or course of treatment. Therefore, we ask that you please give us 2 business days to thoroughly review all your results before contacting the office for clarification. Should we see a critical lab result, you will be contacted sooner.   If You Need Anything After Your Visit  If you have any questions or concerns for your doctor, please call our main line at [phone] and press option 4 to reach your doctor's medical assistant. If no one answers, please leave a voicemail as directed and we will return your call as soon as possible. Messages left after 4 pm will be answered the following business day.   You may also send us a message via MyChart. We typically respond to MyChart messages within 1-2 business days.  For prescription refills, please ask your pharmacy to contact our office. Our fax number is [phone].  If you have an urgent issue when the clinic is closed that cannot wait until the next business day, you can page your doctor at the number below.    Please note that while we do our best to be available for urgent issues   outside of office hours, we are not available 24/7.   If you have an urgent issue and are unable to reach us, you may choose to seek medical care at your doctor's office, retail clinic, urgent care center, or emergency room.  If you have a medical emergency, please immediately call 911 or go to the emergency department.  Pager Numbers  - Dr. Kowalski: [phone]  - Dr. Moye: [phone]  - Dr. Stewart: [phone]  In the event of inclement weather, please call our main line at [phone] for an update on the status of any delays or closures.  Dermatology Medication Tips: Please  keep the boxes that topical medications come in in order to help keep track of the instructions about where and how to use these. Pharmacies typically print the medication instructions only on the boxes and not directly on the medication tubes.   If your medication is too expensive, please contact our office at [phone] option 4 or send us a message through MyChart.   We are unable to tell what your co-pay for medications will be in advance as this is different depending on your insurance coverage. However, we may be able to find a substitute medication at lower cost or fill out paperwork to get insurance to cover a needed medication.   If a prior authorization is required to get your medication covered by your insurance company, please allow us 1-2 business days to complete this process.  Drug prices often vary depending on where the prescription is filled and some pharmacies may offer cheaper prices.  The website www.goodrx.com contains coupons for medications through different pharmacies. The prices here do not account for what the cost may be with help from insurance (it may be cheaper with your insurance), but the website can give you the price if you did not use any insurance.  - You can print the associated coupon and take it with your prescription to the pharmacy.  - You may also stop by our office during regular business hours and pick up a GoodRx coupon card.  - If you need your prescription sent electronically to a different pharmacy, notify our office through [facility] MyChart or by phone at [phone] option 4.     Si Usted Necesita Algo Despus de Su Visita  Tambin puede enviarnos un mensaje a travs de MyChart. Por lo general respondemos a los mensajes de MyChart en el transcurso de 1 a 2 das hbiles.  Para renovar recetas, por favor pida a su farmacia que se ponga en contacto con nuestra oficina. Nuestro nmero de fax es el [phone].  Si tiene un asunto urgente  cuando la clnica est cerrada y que no puede esperar hasta el siguiente da hbil, puede llamar/localizar a su doctor(a) al nmero que aparece a continuacin.   Por favor, tenga en cuenta que aunque hacemos todo lo posible para estar disponibles para asuntos urgentes fuera del horario de oficina, no estamos disponibles las 24 horas del da, los 7 das de la semana.   Si tiene un problema urgente y no puede comunicarse con nosotros, puede optar por buscar atencin mdica  en el consultorio de su doctor(a), en una clnica privada, en un centro de atencin urgente o en una sala de emergencias.  Si tiene una emergencia mdica, por favor llame inmediatamente al 911 o vaya a la sala de emergencias.  Nmeros de bper  - Dr. Kowalski: [phone]  - Dra. Moye: [phone]  - Dra. Stewart: [phone]  En caso   de inclemencias del tiempo, por favor llame a nuestra lnea principal al [phone] para una actualizacin sobre el estado de cualquier retraso o cierre.  Consejos para la medicacin en dermatologa: Por favor, guarde las cajas en las que vienen los medicamentos de uso tpico para ayudarle a seguir las instrucciones sobre dnde y cmo usarlos. Las farmacias generalmente imprimen las instrucciones del medicamento slo en las cajas y no directamente en los tubos del medicamento.   Si su medicamento es muy caro, por favor, pngase en contacto con nuestra oficina llamando al [phone] y presione la opcin 4 o envenos un mensaje a travs de MyChart.   No podemos decirle cul ser su copago por los medicamentos por adelantado ya que esto es diferente dependiendo de la cobertura de su seguro. Sin embargo, es posible que podamos encontrar un medicamento sustituto a menor costo o llenar un formulario para que el seguro cubra el medicamento que se considera necesario.   Si se requiere una autorizacin previa para que su compaa de seguros cubra su medicamento, por favor permtanos de 1 a 2  das hbiles para completar este proceso.  Los precios de los medicamentos varan con frecuencia dependiendo del lugar de dnde se surte la receta y alguna farmacias pueden ofrecer precios ms baratos.  El sitio web www.goodrx.com tiene cupones para medicamentos de diferentes farmacias. Los precios aqu no tienen en cuenta lo que podra costar con la ayuda del seguro (puede ser ms barato con su seguro), pero el sitio web puede darle el precio si no utiliz ningn seguro.  - Puede imprimir el cupn correspondiente y llevarlo con su receta a la farmacia.  - Tambin puede pasar por nuestra oficina durante el horario de atencin regular y recoger una tarjeta de cupones de GoodRx.  - Si necesita que su receta se enve electrnicamente a una farmacia diferente, informe a nuestra oficina a travs de MyChart de [facility] o por telfono llamando al [phone] y presione la opcin 4.  

## 2022-02-08 ENCOUNTER — Ambulatory Visit: Payer: Medicare Other | Admitting: Dermatology

## 2022-02-13 ENCOUNTER — Other Ambulatory Visit: Payer: Self-pay | Admitting: Family Medicine

## 2022-02-13 ENCOUNTER — Ambulatory Visit
Admission: RE | Admit: 2022-02-13 | Discharge: 2022-02-13 | Disposition: A | Discharge status: Home / Self Care | Payer: Medicare Other | Source: Ambulatory Visit | Attending: Family Medicine | Admitting: Family Medicine

## 2022-02-13 DIAGNOSIS — M79605 Pain in left leg: Secondary | ICD-10-CM | POA: Diagnosis present

## 2022-02-15 ENCOUNTER — Other Ambulatory Visit: Payer: Self-pay | Admitting: Sports Medicine

## 2022-02-15 DIAGNOSIS — M47816 Spondylosis without myelopathy or radiculopathy, lumbar region: Secondary | ICD-10-CM

## 2022-02-15 DIAGNOSIS — M5137 Other intervertebral disc degeneration, lumbosacral region: Secondary | ICD-10-CM

## 2022-02-15 DIAGNOSIS — M5136 Other intervertebral disc degeneration, lumbar region: Secondary | ICD-10-CM

## 2022-02-15 DIAGNOSIS — R200 Anesthesia of skin: Secondary | ICD-10-CM

## 2022-02-15 DIAGNOSIS — M79605 Pain in left leg: Secondary | ICD-10-CM

## 2022-02-23 ENCOUNTER — Encounter: Payer: Self-pay | Admitting: Dermatology

## 2022-02-24 ENCOUNTER — Ambulatory Visit
Admission: RE | Admit: 2022-02-24 | Discharge: 2022-02-24 | Disposition: A | Discharge status: Home / Self Care | Payer: Medicare Other | Source: Ambulatory Visit | Attending: Sports Medicine | Admitting: Sports Medicine

## 2022-02-24 DIAGNOSIS — R200 Anesthesia of skin: Secondary | ICD-10-CM | POA: Diagnosis present

## 2022-02-24 DIAGNOSIS — M79605 Pain in left leg: Secondary | ICD-10-CM | POA: Diagnosis not present

## 2022-02-24 DIAGNOSIS — M47816 Spondylosis without myelopathy or radiculopathy, lumbar region: Secondary | ICD-10-CM | POA: Insufficient documentation

## 2022-02-24 DIAGNOSIS — M5136 Other intervertebral disc degeneration, lumbar region: Secondary | ICD-10-CM | POA: Diagnosis present

## 2022-02-24 DIAGNOSIS — M5137 Other intervertebral disc degeneration, lumbosacral region: Secondary | ICD-10-CM

## 2022-08-21 ENCOUNTER — Encounter: Payer: Self-pay | Admitting: Neurosurgery

## 2022-08-21 ENCOUNTER — Ambulatory Visit (INDEPENDENT_AMBULATORY_CARE_PROVIDER_SITE_OTHER): Payer: Medicare Other | Admitting: Neurosurgery

## 2022-08-21 VITALS — BP 115/70 | Ht 72.0 in | Wt 182.0 lb

## 2022-08-21 DIAGNOSIS — M48062 Spinal stenosis, lumbar region with neurogenic claudication: Secondary | ICD-10-CM

## 2022-08-21 NOTE — Progress Notes (Signed)
Referring Physician:  Marisue Ivan, MD [phone] [facility] [address] [facility] [place],  [place] [address]  Primary Physician:  Marisue Ivan, MD  History of Present Illness: 08/21/2022 [name] is a 82 y.o with a history of, hypertension, HLD,  anxiety, actinic keratosis, diabetes (A1c on 08/07/22 5.8), hypothyroidism,anemia, who is here today with a chief complaint of about 8 months of left rating leg pain.  He states that this started in August or September of last year.  He was initially seen by his primary care provider and was started on steroids which provided significant relief.  Unfortunately he has had several recurrent episodes since despite gabapentin, prednisone, and acupuncture.  Today he describes pain that radiates from his left hip down his leg with associated numbness and tingling into his calf and bottom of his left foot.  He is not currently having any significant back pain or pain into his right leg.  He states that it is worse with standing, bending, and walking and is particularly worse towards the end of the day.  He describes tightness in his calf and behind his left knee.  He underwent physical therapy in December for about 4 weeks which did provide some relief and he has been doing the exercises on his own since then.  Conservative measures:  Physical therapy: 4 weeks of PT At [facility] in December of 2023  Multimodal medical therapy including regular antiinflammatories: Gabapentin, Tylenol Injections: no epidural steroid injections  Past Surgery: No previous spine surgeries  Alfonse Alpers has no symptoms of cervical myelopathy.  The symptoms are causing a significant impact on the patient's life.   Review of Systems:  A 10 point review of systems is negative, except for the pertinent positives and negatives detailed in the HPI.  Past Medical History: Past Medical History:  Diagnosis Date   Anxiety    BPH (benign prostatic hyperplasia)     Complication of anesthesia    Patient describes having a vasovagal response after anal fistulotomy. States he stood up and passed out in the recovery room   Family history of adverse reaction to anesthesia    Glaucoma    History of basal cell carcinoma 2007   forehead   Hyperlipemia    Hypertension    Insomnia    Prostate cancer (HCC)    PSA elevation     Past Surgical History: Past Surgical History:  Procedure Laterality Date   ANAL FISSURE REPAIR     CATARACT EXTRACTION W/PHACO Left 09/10/2018   Procedure: CATARACT EXTRACTION PHACO AND INTRAOCULAR LENS PLACEMENT (IOC) COMPLICATED LEFT;  Surgeon: Lockie Mola, MD;  [facility];  Service: Ophthalmology;  Laterality: Left;  OMIDRIA   CATARACT EXTRACTION W/PHACO Right 10/01/2018   Procedure: CATARACT EXTRACTION PHACO AND INTRAOCULAR LENS PLACEMENT (IOC) COMPLICATED  RIGHT TORIC LENS;  Surgeon: Lockie Mola, MD;  Location: [facility];  Service: Ophthalmology;  Laterality: Right;   COLONOSCOPY     COLONOSCOPY WITH PROPOFOL N/A 01/13/2019   Procedure: COLONOSCOPY WITH PROPOFOL;  Surgeon: Toledo, Boykin Nearing, MD;  Location: [facility] ENDOSCOPY;  Service: Gastroenterology;  Laterality: N/A;   HERNIA REPAIR     TONSILLECTOMY      Allergies: Allergies as of 08/21/2022   (No Known Allergies)    Medications: Outpatient Encounter Medications as of 08/21/2022  Medication Sig   aspirin EC 81 MG tablet Take 81 mg by mouth daily.   latanoprost (XALATAN) 0.005 % ophthalmic solution Place 1 drop into both  eyes at bedtime.   lisinopril (ZESTRIL) 2.5 MG tablet Take 1 tablet by mouth daily. Patient reports taking 5mg  daily   simvastatin (ZOCOR) 10 MG tablet Take 10 mg by mouth daily at 6 PM.    tamsulosin (FLOMAX) 0.4 MG CAPS capsule Take 0.4 mg by mouth daily.    traZODone (DESYREL) 150 MG tablet Take 150 mg by mouth at bedtime.    No facility-administered encounter medications on file as of 08/21/2022.     Social History: Social History   Tobacco Use   Smoking status: Never   Smokeless tobacco: Never  Vaping Use   Vaping Use: Never used  Substance Use Topics   Alcohol use: Yes    Comment: Liquor or wine once or twice a week   Drug use: No    Family Medical History: Family History  Problem Relation Age of Onset   Cancer Neg Hx     Physical Examination: Today's Vitals   08/21/22 0930  BP: 115/70  Weight: 82.6 kg  Height: 6' (1.829 m)  PainSc: 2   PainLoc: Foot   Body mass index is 24.68 kg/m.  General: Patient is well developed, well nourished, calm, collected, and in no apparent distress. Attention to examination is appropriate.  Psychiatric: Patient is non-anxious.  Head:  Pupils equal, round, and reactive to light.  ENT:  Oral mucosa appears well hydrated.  Neck:   Supple.  Full range of motion.  Respiratory: Patient is breathing without any difficulty.  Extremities: No edema.  Vascular: Palpable dorsal pedal pulses.  Skin:   On exposed skin, there are no abnormal skin lesions.  NEUROLOGICAL:     Awake, alert, oriented to person, place, and time.  Speech is clear and fluent. Fund of knowledge is appropriate.   Cranial Nerves: Pupils equal round and reactive to light.  Facial tone is symmetric.  Facial sensation is symmetric.  ROM of spine: full.  Palpation of spine: non tender.    Strength: Side Biceps Triceps Deltoid Interossei Grip Wrist Ext. Wrist Flex.  R 5 5 5 5 5 5 5   L 5 5 5 5 5 5 5    Side Iliopsoas Quads Hamstring PF DF EHL  R 5 5 5 5 5 5   L 5 5 5 5 5 5    Reflexes are 2+ and symmetric at the biceps, triceps, brachioradialis, patella and achilles.   Hoffman's is absent.  Clonus is not present.  Toes are down-going.  Bilateral upper and lower extremity sensation is intact to light touch except decreased sensation to left foot. Ambulates with the assistance of a cane Medical Decision Making  Imaging: 02/24/22 Lumbar MRI  Narrative &  Impression  CLINICAL DATA:  Low back pain, left leg pain for 7 days   EXAM: MRI LUMBAR SPINE WITHOUT CONTRAST   TECHNIQUE: Multiplanar, multisequence MR imaging of the lumbar spine was performed. No intravenous contrast was administered.   COMPARISON:  None Available.   FINDINGS: Segmentation:  Standard.   Alignment:  Physiologic.   Vertebrae: No acute fracture, evidence of discitis, or aggressive bone lesion.   Conus medullaris and cauda equina: Conus extends to the T12-L1 level. Conus and cauda equina appear normal.   Paraspinal and other soft tissues: No acute paraspinal abnormality.   Disc levels:   Disc spaces: Degenerative disease with disc height loss throughout the lumbar spine. Mild reactive marrow changes scratch them mild reactive endplate changes at L1-2, L4-5 and L5-S1.   T12-L1: No significant disc bulge. No neural  foraminal stenosis. No central canal stenosis.   L1-L2: Broad-based disc bulge. Mild bilateral facet arthropathy. Mild bilateral foraminal stenosis. No spinal stenosis.   L2-L3: Mild broad-based disc bulge. Mild bilateral facet arthropathy. Mild spinal stenosis. No foraminal stenosis.   L3-L4: Broad-based disc bulge flattening the ventral thecal sac. Moderate bilateral facet arthropathy. Moderate spinal stenosis and bilateral lateral recess stenosis. No foraminal stenosis.   L4-L5: Broad-based disc bulge. Moderate bilateral facet arthropathy. Moderate spinal stenosis. Bilateral lateral recess stenosis. Moderate bilateral foraminal stenosis.   L5-S1: Broad-based disc bulge eccentric towards the left. Moderate bilateral facet arthropathy. Moderate left foraminal stenosis. Mild right foraminal stenosis. No spinal stenosis.   IMPRESSION: 1. At L3-4 there is a broad-based disc bulge flattening the ventral thecal sac. Moderate bilateral facet arthropathy. Moderate spinal stenosis and bilateral lateral recess stenosis. 2. At L4-5 there is a  broad-based disc bulge. Moderate bilateral facet arthropathy. Moderate spinal stenosis. Bilateral lateral recess stenosis. Moderate bilateral foraminal stenosis. 3. At L5-S1 there is a broad-based disc bulge eccentric towards the left. Moderate bilateral facet arthropathy. Moderate left foraminal stenosis. Mild right foraminal stenosis. 4. No acute osseous injury of the lumbar spine.     Electronically Signed   By: Elige Ko M.D.   On: 02/26/2022 14:59    I have personally reviewed the images and agree with the above interpretation.  Assessment and Plan: Mr. Colquhoun is a pleasant 82 y.o. male with about 8 months of left radiating leg pain consistent with neurogenic claudication.  His MRI does show significant spondylosis with spinal stenosis particularly at L4-5 and left lateral recess stenosis at this level as well.  He has completed 4 weeks of physical therapy at [facility].  I have sent in a referral to complete another 2 weeks if he is able to tolerate this.  I have also placed a referral to Dr. Yves Dill for ESI. I will see him back in 4-6 weeks to assess his progress.  Should he continue to have symptoms I will likely get him scheduled Dr. Marcell Barlow to discuss any potential surgical options.  He was encouraged to call the office in the interim should he have any questions or concerns.  He expressed understanding and was in agreement with this plan.   Thank you for involving me in the care of this patient.   I spent a total of 30 minutes in both face-to-face and non-face-to-face activities for this visit on the date of this encounter including review of outside records, review of imaging, discussion of symptoms, physical exam, documentation, and order placement.   [facility]

## 2022-09-04 ENCOUNTER — Other Ambulatory Visit: Payer: Self-pay | Admitting: Neurosurgery

## 2022-09-04 MED ORDER — METHYLPREDNISOLONE 4 MG PO TBPK: ORAL_TABLET | ORAL | 0 refills | Status: DC

## 2022-09-20 ENCOUNTER — Ambulatory Visit: Payer: Medicare Other | Admitting: Neurosurgery

## 2022-10-01 NOTE — Progress Notes (Unsigned)
Referring Physician:  Marisue Ivan, MD [phone] [facility] [address] [facility] [place],  [place] [address]  Primary Physician:  Marisue Ivan, MD  History of Present Illness: 10/02/2022 Vincent Schneider is here today with a chief complaint of difficulty with some numbness in his left foot.  He was previously having significant leg pain, but it has been improving with physical therapy.  He is no longer in pain.  He has been able to play golf.  Bending over and gardening make his pain worse.  Laying on his back makes it better.  He does have some numbness in his left foot and some tightness in his left calf.  He was able to play golf recently.  Bowel/Bladder Dysfunction: none  Conservative measures:  Physical therapy: 4 weeks of PT At [facility] in December of 2023; restarted 09/06/22-09/19/22 Multimodal medical therapy including regular antiinflammatories: Gabapentin, Tylenol, medrol dosepack Injections: has not received epidural steroid injections 08/29/2022: Left L5-S1 and left S1 transforaminal ESI by Dr Yves Dill    Past Surgery:  No previous spine surgeries  [name] has no symptoms of cervical myelopathy.  08/21/2022 Vincent Schneider is a 82 y.o with a history of, hypertension, HLD,  anxiety, actinic keratosis, diabetes (A1c on 08/07/22 5.8), hypothyroidism,anemia, who is here today with a chief complaint of about 8 months of left rating leg pain.  He states that this started in August or September of last year.  He was initially seen by his primary care provider and was started on steroids which provided significant relief.  Unfortunately he has had several recurrent episodes since despite gabapentin, prednisone, and acupuncture.  Today he describes pain that radiates from his left hip down his leg with associated numbness and tingling into his calf and bottom of his left foot.  He is not currently having any significant back pain or pain into his right leg.  He states  that it is worse with standing, bending, and walking and is particularly worse towards the end of the day.  He describes tightness in his calf and behind his left knee.  He underwent physical therapy in December for about 4 weeks which did provide some relief and he has been doing the exercises on his own since then.    The symptoms are causing a significant impact on the patient's life.     Past Medical History: Past Medical History:  Diagnosis Date   Anxiety    BPH (benign prostatic hyperplasia)    Complication of anesthesia    Patient describes having a vasovagal response after anal fistulotomy. States he stood up and passed out in the recovery room   Family history of adverse reaction to anesthesia    Glaucoma    History of basal cell carcinoma 2007   forehead   Hyperlipemia    Hypertension    Insomnia    Prostate cancer (HCC)    PSA elevation     Past Surgical History: Past Surgical History:  Procedure Laterality Date   ANAL FISSURE REPAIR     CATARACT EXTRACTION W/PHACO Left 09/10/2018   Procedure: CATARACT EXTRACTION PHACO AND INTRAOCULAR LENS PLACEMENT (IOC) COMPLICATED LEFT;  Surgeon: Lockie Mola, MD;  Location: [facility];  Service: Ophthalmology;  Laterality: Left;  OMIDRIA   CATARACT EXTRACTION W/PHACO Right 10/01/2018   Procedure: CATARACT EXTRACTION PHACO AND INTRAOCULAR LENS PLACEMENT (IOC) COMPLICATED  RIGHT TORIC LENS;  Surgeon: Lockie Mola, MD;  Location: [facility];  Service: Ophthalmology;  Laterality:  Right;   COLONOSCOPY     COLONOSCOPY WITH PROPOFOL N/A 01/13/2019   Procedure: COLONOSCOPY WITH PROPOFOL;  Surgeon: Toledo, Boykin Nearing, MD;  Location: [facility];  Service: Gastroenterology;  Laterality: N/A;   HERNIA REPAIR     TONSILLECTOMY      Allergies: Allergies as of 10/02/2022   (No Known Allergies)    Medications:  Current Outpatient Medications:    aspirin EC 81 MG tablet, Take 81 mg by mouth daily., Disp:  , Rfl:    Cholecalciferol (VITAMIN D-1000 MAX ST) 25 MCG (1000 UT) tablet, Take by mouth., Disp: , Rfl:    DULoxetine (CYMBALTA) 30 MG capsule, , Disp: , Rfl:    gabapentin (NEURONTIN) 300 MG capsule, Take by mouth., Disp: , Rfl:    latanoprost (XALATAN) 0.005 % ophthalmic solution, Place 1 drop into both eyes at bedtime., Disp: , Rfl:    lisinopril (ZESTRIL) 2.5 MG tablet, Take 1 tablet by mouth daily. Patient reports taking 5mg  daily, Disp: , Rfl:    methylPREDNISolone (MEDROL DOSEPAK) 4 MG TBPK tablet, Take as directed on box, Disp: 21 tablet, Rfl: 0   simvastatin (ZOCOR) 10 MG tablet, Take 10 mg by mouth daily at 6 PM. , Disp: , Rfl:    tadalafil (CIALIS) 20 MG tablet, Take by mouth., Disp: , Rfl:    tamsulosin (FLOMAX) 0.4 MG CAPS capsule, Take 0.4 mg by mouth daily. , Disp: , Rfl:    traZODone (DESYREL) 150 MG tablet, Take 150 mg by mouth at bedtime. , Disp: , Rfl:   Social History: Social History   Tobacco Use   Smoking status: Never   Smokeless tobacco: Never  Vaping Use   Vaping Use: Never used  Substance Use Topics   Alcohol use: Yes    Comment: Liquor or wine once or twice a week   Drug use: No    Family Medical History: Family History  Problem Relation Age of Onset   Cancer Neg Hx     Physical Examination: Vitals:   10/02/22 1105  BP: (!) 140/90    General: Patient is in no apparent distress. Attention to examination is appropriate.  Neck:   Supple.  Full range of motion.  Respiratory: Patient is breathing without any difficulty.   NEUROLOGICAL:     Awake, alert, oriented to person, place, and time.  Speech is clear and fluent.   Cranial Nerves: Pupils equal round and reactive to light.  Facial tone is symmetric.  Facial sensation is symmetric. Shoulder shrug is symmetric. Tongue protrusion is midline.  There is no pronator drift.  Strength: Side Biceps Triceps Deltoid Interossei Grip Wrist Ext. Wrist Flex.  R 5 5 5 5 5 5 5   L 5 5 5 5 5 5 5    Side  Iliopsoas Quads Hamstring PF DF EHL  R 5 5 5 5 5 5   L 5 5 5 5 5 5    Reflexes are 1+ and symmetric at the biceps, triceps, brachioradialis, patella and achilles.   Hoffman's is absent.   Bilateral upper and lower extremity sensation is intact to light touch.    No evidence of dysmetria noted.  Gait is slightly stooped.     Medical Decision Making  Imaging: MRI lumbar spine on February 24, 2022 IMPRESSION: 1. At L3-4 there is a broad-based disc bulge flattening the ventral thecal sac. Moderate bilateral facet arthropathy. Moderate spinal stenosis and bilateral lateral recess stenosis. 2. At L4-5 there is a broad-based disc bulge. Moderate bilateral facet arthropathy.  Moderate spinal stenosis. Bilateral lateral recess stenosis. Moderate bilateral foraminal stenosis. 3. At L5-S1 there is a broad-based disc bulge eccentric towards the left. Moderate bilateral facet arthropathy. Moderate left foraminal stenosis. Mild right foraminal stenosis. 4. No acute osseous injury of the lumbar spine.     Electronically Signed   By: Elige Ko M.D.   On: 02/26/2022 14:59  I have personally reviewed the images and agree with the above interpretation.  Assessment and Plan: Mr. Grzelak is a pleasant 82 y.o. 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  His symptoms are improved currently.  At this point, I would hold off on any surgical intervention.  He is doing better with physical therapy.  Will continue to monitor.  He will contact us if his pain worsens.    Thank you for involving me in the care of this patient.      Denzell Colasanti K. Myer Haff MD, [facility] Neurosurgery

## 2022-10-02 ENCOUNTER — Ambulatory Visit: Payer: Medicare Other | Admitting: Neurosurgery

## 2022-10-02 ENCOUNTER — Ambulatory Visit (INDEPENDENT_AMBULATORY_CARE_PROVIDER_SITE_OTHER): Payer: Medicare Other | Admitting: Neurosurgery

## 2022-10-02 ENCOUNTER — Encounter: Payer: Self-pay | Admitting: Neurosurgery

## 2022-10-02 VITALS — BP 140/90 | Ht 75.0 in | Wt 181.6 lb

## 2022-10-02 DIAGNOSIS — M48062 Spinal stenosis, lumbar region with neurogenic claudication: Secondary | ICD-10-CM | POA: Diagnosis not present

## 2023-02-14 ENCOUNTER — Ambulatory Visit: Payer: Medicare Other | Admitting: Dermatology

## 2023-02-14 DIAGNOSIS — Z872 Personal history of diseases of the skin and subcutaneous tissue: Secondary | ICD-10-CM

## 2023-02-14 DIAGNOSIS — L299 Pruritus, unspecified: Secondary | ICD-10-CM

## 2023-02-14 DIAGNOSIS — D692 Other nonthrombocytopenic purpura: Secondary | ICD-10-CM

## 2023-02-14 DIAGNOSIS — M795 Residual foreign body in soft tissue: Secondary | ICD-10-CM

## 2023-02-14 DIAGNOSIS — L814 Other melanin hyperpigmentation: Secondary | ICD-10-CM

## 2023-02-14 DIAGNOSIS — Z85828 Personal history of other malignant neoplasm of skin: Secondary | ICD-10-CM

## 2023-02-14 DIAGNOSIS — W908XXA Exposure to other nonionizing radiation, initial encounter: Secondary | ICD-10-CM | POA: Diagnosis not present

## 2023-02-14 DIAGNOSIS — L578 Other skin changes due to chronic exposure to nonionizing radiation: Secondary | ICD-10-CM

## 2023-02-14 DIAGNOSIS — L853 Xerosis cutis: Secondary | ICD-10-CM

## 2023-02-14 DIAGNOSIS — L821 Other seborrheic keratosis: Secondary | ICD-10-CM

## 2023-02-14 DIAGNOSIS — D1801 Hemangioma of skin and subcutaneous tissue: Secondary | ICD-10-CM

## 2023-02-14 DIAGNOSIS — Z1283 Encounter for screening for malignant neoplasm of skin: Secondary | ICD-10-CM | POA: Diagnosis not present

## 2023-02-14 DIAGNOSIS — D229 Melanocytic nevi, unspecified: Secondary | ICD-10-CM

## 2023-02-14 DIAGNOSIS — L608 Other nail disorders: Secondary | ICD-10-CM

## 2023-02-14 NOTE — Patient Instructions (Addendum)

## 2023-02-14 NOTE — Progress Notes (Signed)
Follow-Up Visit   Subjective  Vincent Schneider is a 82 y.o. male who presents for the following: Skin Cancer Screening and Full Body Skin Exam  The patient presents for Total-Body Skin Exam (TBSE) for skin cancer screening and mole check. The patient has spots, moles and lesions to be evaluated, some may be new or changing and the patient may have concern these could be cancer. The patient has a history of BCC of the forehead, 2007. He also has a dark spot under the right thumb nail, still present from last year, no changes. Hx of AKs. Some itchy spots on back.    The following portions of the chart were reviewed this encounter and updated as appropriate: medications, allergies, medical history  Review of Systems:  No other skin or systemic complaints except as noted in HPI or Assessment and Plan.  Objective  Well appearing patient in no apparent distress; mood and affect are within normal limits.  A full examination was performed including scalp, head, eyes, ears, nose, lips, neck, chest, axillae, abdomen, back, buttocks, bilateral upper extremities, bilateral lower extremities, hands, feet, fingers, toes, fingernails, and toenails. All findings within normal limits unless otherwise noted below.   Relevant physical exam findings are noted in the Assessment and Plan.    Assessment & Plan   SKIN CANCER SCREENING PERFORMED TODAY.  ACTINIC DAMAGE - Chronic condition, secondary to cumulative UV/sun exposure - diffuse scaly erythematous macules with underlying dyspigmentation - Recommend daily broad spectrum sunscreen SPF 30+ to sun-exposed areas, reapply every 2 hours as needed.  - Staying in the shade or wearing long sleeves, sun glasses (UVA+UVB protection) and wide brim hats (4-inch brim around the entire circumference of the hat) are also recommended for sun protection.  - Call for new or changing lesions.  LENTIGINES, SEBORRHEIC KERATOSES, HEMANGIOMAS - Benign normal skin lesions -  Benign-appearing - Call for any changes  MELANOCYTIC NEVI - Tan-brown and/or pink-flesh-colored symmetric macules and papules - Benign appearing on exam today - Observation - Call clinic for new or changing moles - Recommend daily use of broad spectrum spf 30+ sunscreen to sun-exposed areas.   HISTORY OF BASAL CELL CARCINOMA OF THE SKIN Forehead, 2007 - No evidence of recurrence today - Recommend regular full body skin exams - Recommend daily broad spectrum sunscreen SPF 30+ to sun-exposed areas, reapply every 2 hours as needed.  - Call if any new or changing lesions are noted between office visits  Purpura - Chronic; persistent and recurrent.  Treatable, but not curable. - Violaceous macules and patches - Benign - Related to trauma, age, sun damage and/or use of blood thinners, chronic use of topical and/or oral steroids - Observe - Can use OTC arnica containing moisturizer such as Dermend Bruise Formula if desired - Call for worsening or other concerns  Xerosis with Pruritis - diffuse xerotic patches - recommend gentle, hydrating skin care - gentle skin care handout given - Recommend Eucerin moisturizer and Sarna lotion.    Possible Foreign Body  Exam: 1.5 mm brown spot of the right thumb nail; stable compared to photo 02/07/2022.    If bothersome, discussed partial nail removal to remove brown area. Not bothersome to patient, no pain. He prefers to observe for now. If changes, will discuss treatment.   Return in about 1 year (around 02/14/2024) for TBSE, Hx BCC.  Wendee Beavers, CMA, am acting as scribe for Armida Sans, MD .   Documentation: I have reviewed the above documentation for  accuracy and completeness, and I agree with the above.  Armida Sans, MD

## 2023-02-25 ENCOUNTER — Encounter: Payer: Self-pay | Admitting: Dermatology

## 2024-02-20 ENCOUNTER — Ambulatory Visit: Payer: Medicare Other | Admitting: Dermatology

## 2024-02-20 ENCOUNTER — Encounter: Payer: Self-pay | Admitting: Dermatology

## 2024-02-20 DIAGNOSIS — Z1283 Encounter for screening for malignant neoplasm of skin: Secondary | ICD-10-CM | POA: Diagnosis not present

## 2024-02-20 DIAGNOSIS — W908XXA Exposure to other nonionizing radiation, initial encounter: Secondary | ICD-10-CM

## 2024-02-20 DIAGNOSIS — L578 Other skin changes due to chronic exposure to nonionizing radiation: Secondary | ICD-10-CM | POA: Diagnosis not present

## 2024-02-20 DIAGNOSIS — Z85828 Personal history of other malignant neoplasm of skin: Secondary | ICD-10-CM

## 2024-02-20 DIAGNOSIS — L820 Inflamed seborrheic keratosis: Secondary | ICD-10-CM

## 2024-02-20 DIAGNOSIS — L821 Other seborrheic keratosis: Secondary | ICD-10-CM

## 2024-02-20 DIAGNOSIS — L570 Actinic keratosis: Secondary | ICD-10-CM

## 2024-02-20 DIAGNOSIS — L814 Other melanin hyperpigmentation: Secondary | ICD-10-CM | POA: Diagnosis not present

## 2024-02-20 DIAGNOSIS — D692 Other nonthrombocytopenic purpura: Secondary | ICD-10-CM

## 2024-02-20 DIAGNOSIS — D229 Melanocytic nevi, unspecified: Secondary | ICD-10-CM

## 2024-02-20 NOTE — Progress Notes (Signed)
 Follow-Up Visit   Subjective  Vincent Schneider is a 83 y.o. male who presents for the following: Skin Cancer Screening and Full Body Skin Exam  The patient presents for Total-Body Skin Exam (TBSE) for skin cancer screening and mole check. The patient has spots, moles and lesions to be evaluated, some may be new or changing and the patient may have concern these could be cancer.  The following portions of the chart were reviewed this encounter and updated as appropriate: medications, allergies, medical history  Review of Systems:  No other skin or systemic complaints except as noted in HPI or Assessment and Plan.  Objective  Well appearing patient in no apparent distress; mood and affect are within normal limits.  A full examination was performed including scalp, head, eyes, ears, nose, lips, neck, chest, axillae, abdomen, back, buttocks, bilateral upper extremities, bilateral lower extremities, hands, feet, fingers, toes, fingernails, and toenails. All findings within normal limits unless otherwise noted below.   Relevant physical exam findings are noted in the Assessment and Plan.  right neck x 2, face x 7 (7) Stuck-on, waxy, tan-brown papules and plaques -- Discussed benign etiology and prognosis.  face, ears x 3 (3) Erythematous thin papules/macules with gritty scale.   Assessment & Plan   SKIN CANCER SCREENING PERFORMED TODAY.  ACTINIC DAMAGE - Chronic condition, secondary to cumulative UV/sun exposure - diffuse scaly erythematous macules with underlying dyspigmentation - Recommend daily broad spectrum sunscreen SPF 30+ to sun-exposed areas, reapply every 2 hours as needed.  - Staying in the shade or wearing long sleeves, sun glasses (UVA+UVB protection) and wide brim hats (4-inch brim around the entire circumference of the hat) are also recommended for sun protection.  - Call for new or changing lesions.  LENTIGINES, SEBORRHEIC KERATOSES, HEMANGIOMAS - Benign normal skin  lesions - Benign-appearing - Call for any changes  MELANOCYTIC NEVI - Tan-brown and/or pink-flesh-colored symmetric macules and papules - Benign appearing on exam today - Observation - Call clinic for new or changing moles - Recommend daily use of broad spectrum spf 30+ sunscreen to sun-exposed areas.   Purpura - Chronic; persistent and recurrent.  Treatable, but not curable. - Violaceous macules and patches - Benign - Related to trauma, age, sun damage and/or use of blood thinners, chronic use of topical and/or oral steroids - Observe - Can use OTC arnica containing moisturizer such as Dermend Bruise Formula if desired - Call for worsening or other concerns    HISTORY OF BASAL CELL CARCINOMA OF THE SKIN - No evidence of recurrence today - Recommend regular full body skin exams - Recommend daily broad spectrum sunscreen SPF 30+ to sun-exposed areas, reapply every 2 hours as needed.  - Call if any new or changing lesions are noted between office visits   INFLAMED SEBORRHEIC KERATOSIS (7) right neck x 2, face x 7 (7) Symptomatic, irritating, patient would like treated.  Destruction of lesion - right neck x 2, face x 7 (7) Complexity: simple   Destruction method: cryotherapy   Informed consent: discussed and consent obtained   Timeout:  patient name, date of birth, surgical site, and procedure verified Lesion destroyed using liquid nitrogen: Yes   Region frozen until ice ball extended beyond lesion: Yes   Outcome: patient tolerated procedure well with no complications   Post-procedure details: wound care instructions given    AK (ACTINIC KERATOSIS) (3) face, ears x 3 (3) ACTINIC DAMAGE - chronic, secondary to cumulative UV radiation exposure/sun exposure over time -  diffuse scaly erythematous macules with underlying dyspigmentation - Recommend daily broad spectrum sunscreen SPF 30+ to sun-exposed areas, reapply every 2 hours as needed.  - Recommend staying in the shade or  wearing long sleeves, sun glasses (UVA+UVB protection) and wide brim hats (4-inch brim around the entire circumference of the hat). - Call for new or changing lesions.  Destruction of lesion - face, ears x 3 (3) Complexity: simple   Destruction method: cryotherapy   Informed consent: discussed and consent obtained   Timeout:  patient name, date of birth, surgical site, and procedure verified Lesion destroyed using liquid nitrogen: Yes   Region frozen until ice ball extended beyond lesion: Yes   Outcome: patient tolerated procedure well with no complications   Post-procedure details: wound care instructions given      Return in about 1 year (around 02/19/2025) for TBSE.  IFay Kirks, CMA, am acting as scribe for Alm Rhyme, MD .   Documentation: I have reviewed the above documentation for accuracy and completeness, and I agree with the above.  Alm Rhyme, MD

## 2024-02-20 NOTE — Patient Instructions (Addendum)

## 2024-03-18 ENCOUNTER — Encounter: Payer: Self-pay | Admitting: Family Medicine

## 2024-03-18 DIAGNOSIS — R195 Other fecal abnormalities: Secondary | ICD-10-CM

## 2024-03-18 DIAGNOSIS — R159 Full incontinence of feces: Secondary | ICD-10-CM

## 2024-03-19 ENCOUNTER — Other Ambulatory Visit: Payer: Self-pay | Admitting: Family Medicine

## 2024-03-19 DIAGNOSIS — R195 Other fecal abnormalities: Secondary | ICD-10-CM

## 2024-03-19 DIAGNOSIS — R159 Full incontinence of feces: Secondary | ICD-10-CM

## 2024-03-23 ENCOUNTER — Inpatient Hospital Stay
Admission: RE | Admit: 2024-03-23 | Discharge: 2024-03-23 | Disposition: A | Source: Ambulatory Visit | Attending: Family Medicine | Admitting: Family Medicine

## 2024-03-23 DIAGNOSIS — R159 Full incontinence of feces: Secondary | ICD-10-CM

## 2024-03-23 DIAGNOSIS — R195 Other fecal abnormalities: Secondary | ICD-10-CM

## 2024-03-23 MED ORDER — IOPAMIDOL (ISOVUE-300) INJECTION 61%
100.0000 mL | Freq: Once | INTRAVENOUS | Status: AC | PRN
  Administered 2024-03-23: 12:00:00 100 mL via INTRAVENOUS

## 2025-02-11 ENCOUNTER — Ambulatory Visit: Admitting: Dermatology
